# Patient Record
Sex: Male | Born: 2000 | Race: Black or African American | Hispanic: No | Marital: Single | Smoking: Never smoker
Health system: Southern US, Community
[De-identification: ages and names within clinical notes are randomized; demographics above are authoritative.]

## PROBLEM LIST (undated history)

## (undated) DIAGNOSIS — F840 Autistic disorder: Secondary | ICD-10-CM

## (undated) DIAGNOSIS — F819 Developmental disorder of scholastic skills, unspecified: Secondary | ICD-10-CM

## (undated) DIAGNOSIS — Z91010 Allergy to peanuts: Secondary | ICD-10-CM

## (undated) DIAGNOSIS — J309 Allergic rhinitis, unspecified: Secondary | ICD-10-CM

## (undated) DIAGNOSIS — T7840XA Allergy, unspecified, initial encounter: Secondary | ICD-10-CM

## (undated) HISTORY — PX: OTHER SURGICAL HISTORY: SHX169

## (undated) HISTORY — DX: Allergy to peanuts: Z91.010

## (undated) HISTORY — DX: Allergy, unspecified, initial encounter: T78.40XA

## (undated) HISTORY — DX: Autistic disorder: F84.0

---
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
## 2019-01-02 ENCOUNTER — Ambulatory Visit (HOSPITAL_COMMUNITY)
Admission: EM | Admit: 2019-01-02 | Discharge: 2019-01-02 | Disposition: A | Discharge status: Home / Self Care | Payer: Medicaid Other | Attending: Urgent Care | Admitting: Urgent Care

## 2019-01-02 ENCOUNTER — Other Ambulatory Visit: Payer: Self-pay

## 2019-01-02 ENCOUNTER — Encounter (HOSPITAL_COMMUNITY): Payer: Self-pay | Admitting: Emergency Medicine

## 2019-01-02 DIAGNOSIS — A084 Viral intestinal infection, unspecified: Secondary | ICD-10-CM

## 2019-01-02 DIAGNOSIS — R0982 Postnasal drip: Secondary | ICD-10-CM

## 2019-01-02 DIAGNOSIS — R1110 Vomiting, unspecified: Secondary | ICD-10-CM

## 2019-01-02 MED ORDER — ONDANSETRON 4 MG PO TBDP: 4.0000 mg | ORAL_TABLET | Freq: Three times a day (TID) | ORAL | 0 refills | Status: DC | PRN

## 2019-01-02 MED ORDER — LEVOCETIRIZINE DIHYDROCHLORIDE 5 MG PO TABS: 5.0000 mg | ORAL_TABLET | Freq: Every evening | ORAL | 1 refills | Status: DC

## 2019-01-02 NOTE — ED Triage Notes (Signed)
Poor appetite for a week.  Mother also reports a lot of sleeping.   Last week had cough, diarrhea and vomiting Monday-Wednesday.  pcp gave nausea medicine and this resolved.

## 2019-01-02 NOTE — ED Provider Notes (Signed)
MRN: [id number] DOB: [birth year]  Subjective:   Brent Yang is a 18 y.o. male presenting for 6 day history of n/v, diarrhea. He saw his PCP, improved his n/v, diarrhea. Hasn't had any more of this after taking Zofran. He does not have regular bm. His appetite is decreased from smelling food. He is asking for foods but cannot follow through on eating, mom suspects this is from vomiting. He has allergy to peanuts and mom wonders if he had some peanuts by accident. She tried to use epi pen but thinks she did it wrong. She states that today he is acting more like his normal self.  No current facility-administered medications for this encounter.   Current Outpatient Medications:  .  Amantadine HCl 100 MG tablet, Take 100 mg by mouth 2 (two) times daily., Disp: , Rfl:  .  Carbamazepine (EQUETRO) 300 MG CP12, Take by mouth., Disp: , Rfl:  .  EPINEPHrine 0.3 mg/0.3 mL IJ SOAJ injection, Inject 0.3 mg into the muscle., Disp: , Rfl:  .  EPINEPHrine 0.3 mg/0.3 mL IJ SOAJ injection, Inject contents of one device, 0.3 ml, into muscle in event of anaphylaxis, Disp: 2 Device, Rfl: 12 .  levocetirizine (XYZAL) 5 MG tablet, Take 1 tablet (5 mg total) by mouth every evening., Disp: 90 tablet, Rfl: 1 .  ondansetron (ZOFRAN-ODT) 4 MG disintegrating tablet, Take 1 tablet (4 mg total) by mouth every 8 (eight) hours as needed for nausea or vomiting., Disp: 15 tablet, Rfl: 0 .  paliperidone (INVEGA) 3 MG 24 hr tablet, TAKE 1 TABLET (3 MG TOTAL) BY MOUTH TWO (2) TIMES A DAY., Disp: , Rfl: 3 .  paliperidone (INVEGA) 6 MG 24 hr tablet, Take 6 mg by mouth daily., Disp: , Rfl:  .  Polyethylene Glycol 3350 (PEG 3350) POWD, Follow instructions for cleanout. Then mix 1 cap in 8 oz of clear liquid daily, can increase to twice daily as needed., Disp: , Rfl:  .  propranolol (INDERAL) 80 MG tablet, Take 80 mg by mouth 3 (three) times daily., Disp: , Rfl:    Allergies  Allergen Reactions  . Peanuts [Peanut Oil] Other (See  Comments)    Per allergy test    Past Medical History:  Diagnosis Date  . Allergy   . Autism   . Peanut allergy      Past Surgical History:  Procedure Laterality Date  . dental extractions      Review of Systems  Constitutional: Negative for fever and malaise/fatigue.  HENT: Positive for congestion. Negative for ear pain, sinus pain and sore throat.   Eyes: Negative for blurred vision, double vision, discharge and redness.  Respiratory: Negative for cough, hemoptysis, shortness of breath and wheezing.   Cardiovascular: Negative for chest pain.  Gastrointestinal: Positive for diarrhea, nausea and vomiting. Negative for abdominal pain.  Genitourinary: Negative for dysuria, flank pain and hematuria.  Musculoskeletal: Negative for myalgias.  Skin: Negative for rash.  Neurological: Negative for weakness and headaches.  Psychiatric/Behavioral: Negative for depression and substance abuse.    Objective:   Vitals: BP 111/88 (BP Location: Left Arm)   Pulse 98   Temp 98.2 F (36.8 C) (Temporal)   Resp 18   SpO2 100%   Physical Exam Constitutional:      Appearance: Normal appearance. He is well-developed and normal weight.  HENT:     Head: Normocephalic and atraumatic.     Right Ear: External ear normal.     Left Ear: External ear normal.  Nose: Nose normal.     Mouth/Throat:     Pharynx: Oropharynx is clear. No oropharyngeal exudate or posterior oropharyngeal erythema.     Comments: Significant post-nasal drainage. Eyes:     Extraocular Movements: Extraocular movements intact.     Pupils: Pupils are equal, round, and reactive to light.  Cardiovascular:     Rate and Rhythm: Normal rate.  Pulmonary:     Effort: Pulmonary effort is normal.  Neurological:     Mental Status: He is alert and oriented to person, place, and time.  Psychiatric:        Mood and Affect: Mood normal.        Behavior: Behavior normal.    Assessment and Plan :   Viral  gastroenteritis  Post-nasal drainage  Vomiting, intractability of vomiting not specified, presence of nausea not specified, unspecified vomiting type - Plan: ondansetron (ZOFRAN-ODT) 4 MG disintegrating tablet  I suspect patient had a viral gastroenteritis that he is recovering from.  He may have an aversion right now and due to his autism is having a difficult time expressing this interesting foods as he had multiple episodes of nausea vomiting and diarrhea earlier last week.  Counseled patient's mother on supportive care, she feels reassured that he is returning to his normal self.  Recommended he use levocetirizine for his throat.  He does not have any signs of strep throat and therefore will not swab him.  Vital signs are very reassuring.  Patient's mother does report a strong history of allergies.  Will use levocetirizine for this.  Follow-up with PCP. Counseled patient on potential for adverse effects with medications prescribed today, patient verbalized understanding.    Wallis BambergMani, Kahealani Yankovich, PA-C 01/02/19 1056

## 2019-07-21 ENCOUNTER — Telehealth: Payer: Self-pay

## 2019-07-21 NOTE — Telephone Encounter (Signed)

## 2019-07-22 ENCOUNTER — Ambulatory Visit (INDEPENDENT_AMBULATORY_CARE_PROVIDER_SITE_OTHER): Payer: Medicaid Other | Admitting: Pediatrics

## 2019-07-22 ENCOUNTER — Telehealth: Payer: Self-pay | Admitting: Pediatrics

## 2019-07-22 ENCOUNTER — Other Ambulatory Visit: Payer: Self-pay

## 2019-07-22 VITALS — BP 120/78 | Ht 74.5 in | Wt 193.8 lb

## 2019-07-22 DIAGNOSIS — Z91010 Allergy to peanuts: Secondary | ICD-10-CM

## 2019-07-22 DIAGNOSIS — Z0001 Encounter for general adult medical examination with abnormal findings: Secondary | ICD-10-CM

## 2019-07-22 DIAGNOSIS — Z68.41 Body mass index (BMI) pediatric, 5th percentile to less than 85th percentile for age: Secondary | ICD-10-CM | POA: Diagnosis not present

## 2019-07-22 DIAGNOSIS — F840 Autistic disorder: Secondary | ICD-10-CM

## 2019-07-22 DIAGNOSIS — F71 Moderate intellectual disabilities: Secondary | ICD-10-CM

## 2019-07-22 MED ORDER — EPINEPHRINE 0.3 MG/0.3ML IJ SOAJ: INTRAMUSCULAR | 3 refills | Status: DC

## 2019-07-22 NOTE — Telephone Encounter (Signed)
Care Coordinator called and requested the status of a Level of Care Form to [facility] for the transitioning of care of the patient to a different facility. The Care Coordinator states that she faxed the form to [place] on Friday and that they patient was seen today (07/22/2019) and that the form should have been filled out today. She is requesting that the form be emailed or faxed to her at:  Email: [email or web address] Fax Number: [phone]  If there are any questions we may contact [phone], we may also contact this number when the form is sent.

## 2019-07-22 NOTE — Progress Notes (Signed)
Adolescent Well Care Visit Brent Yang is a 18 y.o. male who is here for well care.  He is accompanied by his mother.    PCP:  Lurlean Leyden, MD   History was provided by the mother.  Confidentiality was discussed with the patient and, if applicable, with caregiver as well. Patient's personal or confidential phone number: n/a   Current Issues: Current concerns include Cledis has Autism Spectrum Disorder and Intellectual Disability.  He is for his annual physical exam and for completion of paperwork for admission .  His last office visit was 2 years ago. Notes in Epic on mental health care at [facility] show no updates since March 2019. Mom states he is going to be inpatient/residential at [facility] for better assessment of diagnosis and care.  States she does not know how long because one source told her one month and another told her up to two years.  He is to be admitted November 10th.  Nutrition: Nutrition/Eating Behaviors: Mom states he has a good appetite and needs no dietary modifications Adequate calcium in diet?: yes Supplements/ Vitamins: no  Exercise/ Media: Play any Sports?/ Exercise: active in the home Screen Time:  not discussed Media Rules or Monitoring?: not discussed  Sleep:  Sleep: sleeps through the night  Social Screening: Lives with:  Parents and sister Parental relations:  good Activities, Work, and Research officer, political party?: can pick up after himself Concerns regarding behavior with peers?  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 He received physical, occupational and speech therapy at school.  Confidential Social History: Tobacco?  no Secondhand smoke exposure?  no Drugs/ETOH?  no  Sexually Active?  no   Pregnancy Prevention: abstinence  Safe at home, in school & in relationships?  Yes but behavior can be challenging for family to manage Safe to self?  Yes    Screenings: Patient has a dental home: yes  The patient was unable to complete the Rapid Assessment for Adolescent Preventive Services screening questionnaire and the PHQ-9.  Physical Exam:  Vitals:   07/22/19 1120  BP: 120/78  Weight: 193 lb 12.8 oz (87.9 kg)  Height: 6' 2.5" (1.892 m)   BP 120/78   Ht 6' 2.5" (1.892 m)   Wt 193 lb 12.8 oz (87.9 kg)   BMI 24.55 kg/m  Body mass index: body mass index is 24.55 kg/m. Blood pressure percentiles are not available for patients who are 18 years or older.  No exam data present  General Appearance:   alert, oriented, no acute distress and well nourished. Exam is limited because Athel will not remove his shoes, outer wear of lie down on exam table.  He is cooperative and pleasant with seated and standing exam, and will lift his shirt  HENT: Normocephalic, no obvious abnormality, conjunctiva clear  Mouth:   Normal appearing teeth and gums  Neck:   Supple; thyroid: no enlargement, symmetric, no tenderness/mass/nodules  Chest Normal male  Lungs:   Clear to auscultation bilaterally, normal work of breathing  Heart:   Regular rate and rhythm, S1 and S2 normal, no murmurs;   Abdomen:   Soft, non-tender, no mass, or organomegaly appreciated on limited exam with him seated  GU genitalia not examined; he is wearing disposable underwear  Musculoskeletal:   Tone and strength strong and symmetrical, all extremities               Lymphatic:  No cervical adenopathy  Skin/Hair/Nails:   Skin with mild post-inflammatory hyperpigmentation at right antecubital fossa but no active eczema  Neurologic:   Strength and gait appear wnl     Assessment and Plan:   1. Encounter for general adult medical examination with abnormal findings   2. Body mass index, pediatric, 5th percentile to less than 85th percentile for age   73. Peanut allergy   4. Autism spectrum disorder   5. Moderate intellectual disability     BMI is appropriate for age  Hearing  screening result:not examined Vision screening result: not examined  Unable to dispense flu vaccine here due to overage for Vaccines for Children Program; counseled mom on importance of getting vaccine at local pharmacy or at [facility].  Paperwork done for [facility] and faxed; copy of forms, vaccine record and med list given to mother.  Unable to accurately reconcile medications in chart because mom states she does not know names of his medications.  States he takes 4 medications daily and she earlier read the information from all bottles to the staff at [facility] for his new record. I made notation on forms from this office that record from Epic may not be accurate bc I am not the prescribing physician (psychiatry prescribes) and mom not able to clarify; there was not sufficient time to verify with the pharmacy due to need for forms to be done today.   Meds ordered this encounter  Medications  . EPINEPHrine 0.3 mg/0.3 mL IJ SOAJ injection    Sig: Inject contents of one device, 0.3 ml, into muscle in event of anaphylaxis    Dispense:  2 each    Refill:  3    Please dispense generic brand covered by his insurance   Advised on annual physical and discussed transition to adult care next year if he remains in our healthcare ares.  Maree Erie, MD

## 2019-07-22 NOTE — Telephone Encounter (Signed)
Dr. Dorothyann Peng has the form and she will filled them out.

## 2019-07-22 NOTE — Patient Instructions (Signed)
Buckley looks in good health today. I will fax the information to [facility] and mail you a copy for your records.  Please get a Flu vaccine for him at your earliest convenience.  It may be that they have it available at his upcoming placement or you can get it at [facility], etc.  [facility] will be a good place to transition his care by his birthday. Please let me know if this is good for you and I can enter a referral.  Next full check up due in one year.

## 2019-07-23 ENCOUNTER — Encounter: Payer: Self-pay | Admitting: Pediatrics

## 2020-08-21 ENCOUNTER — Other Ambulatory Visit: Payer: Self-pay | Admitting: Pediatrics

## 2020-08-21 DIAGNOSIS — Z91010 Allergy to peanuts: Secondary | ICD-10-CM

## 2020-09-19 ENCOUNTER — Telehealth: Payer: Self-pay

## 2020-09-19 NOTE — Telephone Encounter (Signed)
Please call Mom, Charlyne Quale at [phone] once FMLA form has been complete and is ready to be picked up. Thank you!

## 2020-09-19 NOTE — Telephone Encounter (Signed)
Forms placed in Dr. Lafonda Mosses folder. PE scheduled for 09/24/20.

## 2020-09-20 NOTE — Telephone Encounter (Signed)
Per Dr. Duffy Rhody: she will need to wait until PE 09/24/20 to complete forms.

## 2020-09-24 ENCOUNTER — Ambulatory Visit (INDEPENDENT_AMBULATORY_CARE_PROVIDER_SITE_OTHER): Payer: Medicaid Other | Admitting: Pediatrics

## 2020-09-24 ENCOUNTER — Encounter: Payer: Self-pay | Admitting: Pediatrics

## 2020-09-24 ENCOUNTER — Other Ambulatory Visit: Payer: Self-pay

## 2020-09-24 VITALS — BP 128/80 | HR 97 | Ht 75.2 in | Wt 182.6 lb

## 2020-09-24 DIAGNOSIS — F71 Moderate intellectual disabilities: Secondary | ICD-10-CM

## 2020-09-24 DIAGNOSIS — F840 Autistic disorder: Secondary | ICD-10-CM | POA: Diagnosis not present

## 2020-09-24 DIAGNOSIS — Z6822 Body mass index (BMI) 22.0-22.9, adult: Secondary | ICD-10-CM

## 2020-09-24 DIAGNOSIS — Z113 Encounter for screening for infections with a predominantly sexual mode of transmission: Secondary | ICD-10-CM | POA: Diagnosis not present

## 2020-09-24 DIAGNOSIS — Z0000 Encounter for general adult medical examination without abnormal findings: Secondary | ICD-10-CM | POA: Diagnosis not present

## 2020-09-24 LAB — POCT RAPID HIV: Rapid HIV, POC: NEGATIVE

## 2020-09-24 NOTE — Patient Instructions (Signed)
Continue with healthy eating habits for 5 or more fruits/vegetables a day. Encouraged water more than sweet beverages - try for 60 to 80 mls total fluids daily

## 2020-09-24 NOTE — Progress Notes (Signed)
Adolescent Well Care Visit Brent Yang is a 20 y.o. male who is here for well care. Brent Yang has Autism Spectrum Disorder and Intellectual Disability. He shows good comprehension but utilizes Theatre manager.    PCP:  Maree Erie, MD   History was provided by the mother.  Confidentiality was discussed with the patient and, if applicable, with caregiver as well. Patient's personal or confidential phone number: n/a  Current Issues: Current concerns include doing well.   Skiler has his last visit at this office November 2020 and was then admitted to [facility] in [place], [place] for better assessment and maximizing medication management.  Mom states he was there November 2020 to May 2021.  On discharge, he was placed in a group home setting and transitioned back in his family home full-time July 12, 2020. Mom states current medication management with Dr. Claudine Mouton and last visit was November with plan for visits every 3 months. Med list is reconciled in Epic aided by photos of prescriptions mom shows this physician.  She notes he now has cetirizine instead of levocetirizine and he is taking Vitamin D 2000 units daily.  Mom also states Brent Yang became infected with COVID in 202 while inpatient at [facility]; he later got COVID vaccine there, too.  Nutrition: Nutrition/Eating Behaviors: appetite is good without restrictions, water once a day - prefers sweet beverage Adequate calcium in diet?: yogurt in am x 1 -2 cups Supplements/ Vitamins: Vitamin D, mom wants to add Vitamin C to see if helps his immunity  Exercise/ Media: Play any Sports?/ Exercise: active in school  Screen Time:  Likes to watch basketball Media Rules or Monitoring?: yes  Sleep:  Sleep: varies between 9:30 and 11 pm and up at 6:30 am  Social Screening: Lives with:  Mom and 10 y sister; no pets Parental relations:  good Activities, Work, and Regulatory affairs officer?: takes out trash, can help put away his  clothes Concerns regarding behavior with peers?  no Stressors of note: no Needs help with dressing, regular underwear (disposable undergarments not needed during the day and were not helpful preventing wetting bed at night) MGM respite care provider.  Education: School Name: [employer], [place] [employer] Grade: special classes School performance: doing well; no concerns School Behavior: doing well; no concerns He receives PT and OT at school. Mom states he is able to attend until age 25 years.   Confidential Social History: Tobacco?  no Secondhand smoke exposure?  no Drugs/ETOH?  no  Sexually Active?  no   Pregnancy Prevention: abstinence  Safe at home, in school & in relationships?  Yes Safe to self?  Yes   Screenings: Patient has a dental home: yes  The patient was not able to complete the Rapid Assessment for Adolescent Preventive Services screening questionnaire; however, mom completed the form for him and noted issue with seatbelt compliance.Counseled on personal safety in his environment. PHQ-9 completed by mom for him and at 5 (sleep, concentration, fidgeting).  Mom also noted Brent Yang cried 2 days ago for unsure reason.  She noted year as somewhat difficult for him; no self-harm attempts.  Physical Exam:  Vitals:   09/24/20 0853  BP: 128/80  Pulse: 97  SpO2: 98%  Weight: 182 lb 9.6 oz (82.8 kg)  Height: 6' 3.2" (1.91 m)   BP 128/80 (BP Location: Right Arm, Patient Position: Sitting, Cuff Size: Normal)   Pulse 97   Ht 6' 3.2" (1.91 m)   Wt 182 lb 9.6  oz (82.8 kg)   SpO2 98%   BMI 22.70 kg/m  Body mass index: body mass index is 22.7 kg/m. Growth percentile SmartLinks can only be used for patients less than 40 years old.   Hearing Screening   125Hz  250Hz  500Hz  1000Hz  2000Hz  3000Hz  4000Hz  6000Hz  8000Hz   Right ear:           Left ear:           Comments: OAE left ear refer, right ear noisy   General Appearance:   Alert,  cooperative young man in no apparent distress.  He nods or signs in response to questions.  HENT: Normocephalic, no obvious abnormality, conjunctiva clear  Mouth:   Normal appearing teeth, no obvious dental caries  Neck:   Supple; thyroid: no enlargement, symmetric, no tenderness/mass/nodules  Chest Normal male  Lungs:   Clear to auscultation bilaterally, normal work of breathing  Heart:   Regular rate and rhythm, S1 and S2 normal, no murmurs;   Abdomen:   Soft, non-tender, no mass, or organomegaly  GU genitalia not examined - he refused getting on exam table and did not undress  Musculoskeletal:   Tone and strength strong and symmetrical, all extremities               Lymphatic:   No cervical adenopathy  Skin/Hair/Nails:   Skin warm, dry and intact, normal hair and nails  Neurologic:   Strength, gait, and coordination normal and age-appropriate     Assessment and Plan:   1. Encounter for general adult medical examination without abnormal findings   2. Screening examination for venereal disease   3. BMI 22.0-22.9, adult   4. Autism spectrum disorder   5. Moderate intellectual disability     BMI is appropriate for age; advised continuing healthy lifestyle habits.  Hearing screening result:abnormal; will discuss referral to audiology with mom. Vision screening result: unable to complete  No vaccines indicated today and he is overage for vaccines for this office.  Continue medication management with psychiatry.  Counseled on transition to adult care; mom voiced understanding and okayed referral to [facility]. Wellness visit due annually. , MD

## 2020-09-26 NOTE — Telephone Encounter (Signed)
Form was completed by Dr. Duffy Rhody; appears in media tab.

## 2020-09-29 ENCOUNTER — Encounter: Payer: Self-pay | Admitting: Pediatrics

## 2020-10-10 ENCOUNTER — Telehealth: Payer: Self-pay

## 2020-10-10 NOTE — Telephone Encounter (Signed)
Completed FMLA forms faxed to [facility] Disability [phone] at mom's request; original placed at front desk for parent pick up; copy placed in medical records folder for scanning.

## 2020-10-18 DIAGNOSIS — Z0289 Encounter for other administrative examinations: Secondary | ICD-10-CM

## 2020-12-19 ENCOUNTER — Telehealth: Payer: Self-pay | Admitting: Pediatrics

## 2020-12-19 NOTE — Telephone Encounter (Signed)
I contacted mom about FMLA forms because we just completed them in Jan 2022.  Mom stated she is not sure why they need new forms but she is requesting a schedule change (to be able to get Kelli off the bus afternoons) and thinks that may be reason why. I informed mom information will be the same as in Jan and I should have all ready for her to pick-up at front desk after 1:30 on tomorrow 4/07.  Mom stated problem with Jantz wetting the bed nightly and now wetting himself at school, which is a new problem.  She asks to be referred to Urologist.  I informed mom we should first check his urine for UTI and provide her with specimen container and wipes to get specimen at home and return to office ASAP on the same day as collection.  I asked mom to alert staff at time of forms pick-up that she is expecting to pick up some supplies from me and we will provide what is needed.  Mom voiced agreement with plan and expectation to get to office on Thursday 4/07 or Friday 4/08.

## 2020-12-20 NOTE — Telephone Encounter (Signed)
FMLA forms, urine specimen cup, wipes taken to front desk.

## 2020-12-21 NOTE — Telephone Encounter (Signed)
Ok. Thanks!

## 2020-12-21 NOTE — Telephone Encounter (Signed)
Called mother to check in on urine sample. Mother has been unable to come by clinic yesterday or today. Mother states [facility] has an appt Monday and she would prefer to have urine sample done at appt. Advised mother she may come pick up during Saturday clinic, mother states she would prefer to wait until Monday. Advised mother should Brent Yang have any fever,pain or increased symptoms [name] will need to be seen or sample collected sooner. Mother stated understanding.

## 2020-12-24 ENCOUNTER — Other Ambulatory Visit: Payer: Self-pay

## 2020-12-24 ENCOUNTER — Encounter: Payer: Self-pay | Admitting: Pediatrics

## 2020-12-24 ENCOUNTER — Ambulatory Visit (INDEPENDENT_AMBULATORY_CARE_PROVIDER_SITE_OTHER): Payer: Medicaid Other | Admitting: Pediatrics

## 2020-12-24 VITALS — BP 116/80 | Wt 195.6 lb

## 2020-12-24 DIAGNOSIS — Z7189 Other specified counseling: Secondary | ICD-10-CM

## 2020-12-24 DIAGNOSIS — Z7187 Encounter for pediatric-to-adult transition counseling: Secondary | ICD-10-CM

## 2020-12-24 DIAGNOSIS — F840 Autistic disorder: Secondary | ICD-10-CM

## 2020-12-24 DIAGNOSIS — K5901 Slow transit constipation: Secondary | ICD-10-CM

## 2020-12-24 DIAGNOSIS — R350 Frequency of micturition: Secondary | ICD-10-CM

## 2020-12-24 DIAGNOSIS — R809 Proteinuria, unspecified: Secondary | ICD-10-CM

## 2020-12-24 LAB — POCT URINALYSIS DIPSTICK
Bilirubin, UA: NEGATIVE
Blood, UA: NEGATIVE
Glucose, UA: NEGATIVE
Ketones, UA: NEGATIVE
Leukocytes, UA: NEGATIVE
Nitrite, UA: NEGATIVE
Protein, UA: POSITIVE — AB
Spec Grav, UA: 1.015 (ref 1.010–1.025)
Urobilinogen, UA: 0.2 E.U./dL
pH, UA: 6.5 (ref 5.0–8.0)

## 2020-12-24 MED ORDER — POLYETHYLENE GLYCOL 3350 17 GM/SCOOP PO POWD: ORAL | 6 refills | Status: DC

## 2020-12-24 NOTE — Progress Notes (Signed)
Subjective:    Patient ID: Brent Yang, male    DOB: 28-Aug-2001, 19 y.o.   MRN: [id number]  HPI Brent Yang is here for scheduled follow up due to complex health conditions.  He is diagnosed with Autism Spectrum Disorder and moderate intellectual disability. Brent Yang is accompanied by his mother, who is his primary care taker.  Mom states Brent Yang has continued in good health with exception of new daytime wetting problems and bedwetting most nights. He does not complain of pain.  He has a bowel movement daily but mom states it is "pasty stool" that sticks to his bottom and he does not defecate in the toilet. She states he was previously prescribed Miralax but she never started it. No complaint of pain. Further ROS negative and no modifying factors.  Nutrition:  His appetite is good and he eats a variety with no need for texture adaptation. He does not drink a lot of water.   Sleep:  Sleeping well aided by medication - average 9:30/10 pm to 6 am and stays asleep all night with the medication (would awaken during the night previously) No daytime sleepiness reported at school; may nap after school.  Social:  Attends [employer] and can continue there to age 72 years. Lives with his mom and 1 years old sister.  No pets. He helps with chores at home. He demonstrated good language understanding but does not have typical verbal communication; uses nonverbal communication skills (grunts, head nods and hand movements).  Behavior/Emotional:  Current psychiatric care with Dr. Claudine Mouton. Last appt with psychiatry was 4/06 and will have follow up every 3 months.  Dental Care:  will be in [place] at [facility] once mom completes and sends paperwork  COVID vaccine status:  Moderna vaccine administered 10/25/2019 and 11/22/2019 Other vaccines are Up to Date and listed both in [facility] and this electronic health record.  Medications: Outpatient Encounter  Medications as of 12/24/2020  Medication Sig Note  . polyethylene glycol powder (GLYCOLAX/MIRALAX) 17 GM/SCOOP powder Mix 1 capful in 8 oz liquid and drink once daily to promote soft stools; decrease dose to 1/2 capful if stools too loose   . amantadine (SYMMETREL) 100 MG capsule GIVE 1 CAPSULE BY MOUTH TWICE A DAY(8AM+4PM) FOR EPS PREVENTION   . Carbamazepine (EQUETRO) 300 MG CP12 Take by mouth.   . EPINEPHRINE 0.3 mg/0.3 mL IJ SOAJ injection INJECT CONTENTS OF ONE DEVICE, 0.3 ML, INTO MUSCLE IN EVENT OF ANAPHYLAXIS   . levocetirizine (XYZAL) 5 MG tablet Take 1 tablet (5 mg total) by mouth every evening.   . mirtazapine (REMERON) 15 MG tablet GIVE 1 TABLET BY MOUTH AT BEDTIME FOR SLEEP/MOOD   . paliperidone (INVEGA) 9 MG 24 hr tablet TAKE 1 TABLET BY MOUTH DAILY AT 8AM FOR AGGRESSION/MOOD   . propranolol (INDERAL) 40 MG tablet    . traZODone (DESYREL) 100 MG tablet    . [DISCONTINUED] Amantadine HCl 100 MG tablet Take 100 mg by mouth 2 (two) times daily. 12/24/2020: duplicate entry in his electronic record; he is still taking med  . [DISCONTINUED] paliperidone (INVEGA) 6 MG 24 hr tablet Take 6 mg by mouth daily.    No facility-administered encounter medications on file as of 12/24/2020.  (Verified meds with pharmacist.  Trazodone has not been dispensed since Jan 2022 and appears to be discontinued; however, mom could not remember names of all meds to verify.)  Review of Systems As noted in HPI above.  Objective:   Physical Exam Vitals reviewed.  Constitutional:      General: He is not in acute distress.    Appearance: Normal appearance. He is normal weight.  HENT:     Nose: Nose normal.     Mouth/Throat:     Mouth: Mucous membranes are moist.  Eyes:     Conjunctiva/sclera: Conjunctivae normal.  Cardiovascular:     Rate and Rhythm: Normal rate and regular rhythm.     Pulses: Normal pulses.     Heart sounds: Normal heart sounds. No murmur heard.   Pulmonary:     Effort: Pulmonary  effort is normal.     Breath sounds: Normal breath sounds.  Abdominal:     Palpations: Abdomen is soft.     Comments: Abdominal exam is limited due to patient seated and also demonstrating his desire for this MD to stop the palpation  Neurological:     Mental Status: He is alert.   Blood pressure 116/80, weight 195 lb 9.6 oz (88.7 kg). Results for orders placed or performed in visit on 12/24/20 (from the past 48 hour(s))  POCT urinalysis dipstick     Status: Abnormal   Collection Time: 12/24/20  9:06 AM  Result Value Ref Range   Color, UA     Clarity, UA     Glucose, UA Negative Negative   Bilirubin, UA negative    Ketones, UA negative    Spec Grav, UA 1.015 1.010 - 1.025   Blood, UA negative    pH, UA 6.5 5.0 - 8.0   Protein, UA Positive (A) Negative   Urobilinogen, UA 0.2 0.2 or 1.0 E.U./dL   Nitrite, UA negative    Leukocytes, UA Negative Negative   Appearance     Odor        Assessment & Plan:   1. Frequent urination Proteinuria Urinalysis unremarkable except protein; should be repeated at next visit. Discussed with mom that the increased wetting accidents likely secondary to bowel habits and will start on program to soften stools and have more efficient elimination. No urine culture or antibiotics indicated. Will check CMP due to protein in urine and need to monitor renal function with his medication, Advised patient to increase water intake and he nodded that he will do this. - POCT urinalysis dipstick  2. Slow transit constipation Ongoing thick stools and inadequate bowel emptying is likely cause of the bedwetting and daytime accidents; discussed all with mom. Encouraged mom to start the Miralax on a Friday pm to prevent risk of soiling accidents at school. Discussed starting dose and titration to maintain daily soft stool. Mom voiced willingness to try this. - polyethylene glycol powder (GLYCOLAX/MIRALAX) 17 GM/SCOOP powder; Mix 1 capful in 8 oz liquid and drink  once daily to promote soft stools; decrease dose to 1/2 capful if stools too loose  Dispense: 500 g; Refill: 6  3. Autism spectrum disorder Chronic health concerns are mainly his healthcare maintenance and need to regulate mood and sleep. He is to continue care with Dr. Claudine Mouton, psychiatry. Vincen will continue school attendance to maximum age of 2 (gives him one more year of attendance if desired). Due to his multiple medication and need to monitor renal function, labs entered to be done at [facility] (no phlebotomist available at our office today); mom voiced understanding and ability to take him for the labs. - Ambulatory referral to [facility]  4. Counseling for transition from pediatric to adult care provider Reviewed meds and process with  mom.  Mom stated no specific MD she would like to receive Phi as new patient. Referral to [facility] on [facility] discussed with mom and she agreed referral. Information entered.  He should have his appointment by his [birth year] birthday for follow up on chronic care needs and medication management.  Will need labs monitored (renal function) due to medication and weight gain monitored - recent gain of 13 pounds in 3 months. - Ambulatory referral to [facility]  Maree Erie, MD

## 2020-12-24 NOTE — Patient Instructions (Signed)
No infection in his urine.  Please start the Miralax as we discussed. Start with one capful mixed in liquid daily. Decrease to 1/2 capful if stools are too loose. Skip a day if still too lose. You can go back up on dose if stools become too firm again.  I will send a referral to [facility] to assume his health care this summer. They are located on [address] just down from the Emergency Room.

## 2020-12-27 ENCOUNTER — Encounter: Payer: Self-pay | Admitting: Developmental - Behavioral Pediatrics

## 2020-12-28 ENCOUNTER — Telehealth: Payer: Self-pay | Admitting: Pediatrics

## 2020-12-28 NOTE — Telephone Encounter (Signed)
I called and spoke with mom at 5:34 pm today to ask questions needed to complete med form for surgery.  Mom stated no knowledge of Jacorie having previous intubations or general anesthesia, no previous surgery, no previous seizures or problem with asthma.  Mom also stated he has not had problems with bleeding difficult to control.  She states no knowledge of herself, father, daughter having problems with anesthesia in the past.  Form completed with notation to surgeon that medications are prescribed by the psychiatrist and doses are subject to change from what is listed on the form without my knowledge and should be verified with mom.  Also noted limited exam due to patient tolerance; additionally, it was not known to this physician at time of appointment 12/24/2020 that medical form needed completion.  Form placed in RN box for faxing to surgeon/dentist.

## 2020-12-31 ENCOUNTER — Telehealth: Payer: Self-pay

## 2020-12-31 NOTE — Telephone Encounter (Signed)
Dental pre-op form faxed to [facility] [phone] as requested, confirmation received. Original placed in medical records folder for scanning.

## 2021-01-30 NOTE — Progress Notes (Signed)
    SUBJECTIVE:   CHIEF COMPLAINT / HPI:   Brent Yang is a 20 yo with PMH of autism, constipation, enuresis.  presents with his mom to establish care. Reports previous pediatrician Dr. Duffy Rhody at [facility] but was referred to our clinic due to age.   Patient about 20 minutes late to visit (went to wrong location) and was advised that it would be a shortened visit. Family was fine with this and wanted to proceed with visit today.   Patient is non verbal and mom providing history. Mom reports only concern today is about bed wetting at night. Reports this occurs about every night for many years. States she would take patient to restroom right before bedtime and then 1 hour later will go in the bed. States he will sometimes urinate in trash can instead of toilet. Denies pain or burning when urinating.   Mom also concerned about rapid weight gain. States there is not a change in his diet, but that his medications could be contributing.   Blood pressure today 158/94 which mom believes its because he was recently worked up. On repeat BP 135/80. Reports no previous issues with BP.   Lives with mom, dad, sister who is 44. Lived in [place] Nov 2020-Oct 2021  No pertinent family history    Allergic to peanuts   Medications current in chart   OBJECTIVE:   BP 135/80   Pulse (!) 107   Ht 6\' 3"  (1.905 m)   Wt 210 lb 6.4 oz (95.4 kg)   SpO2 100%   BMI 26.30 kg/m    Physical exam  General: well appearing, NAD Cardiovascular: RRR, no murmurs Lungs: CTAB. Normal WOB Abdomen: soft, non-distended, non-tender Skin: warm, dry. No edema  ASSESSMENT/PLAN:   No problem-specific Assessment & Plan notes found for this encounter.  Enuresis Patient presents with chronic nightly bed wetting. Discussed preventative measures with family.   - continue taking patient to restroom prior to bed - avoid fluids prior to bed time - recommended trying bed alarm mat   Autism Patient non verbal.  Currently on medications to help regulate mood and sleep. Follows with Dr. , psychiatry. Home medications: Amantadine 100mg , Carbamazepine 300mg , Mirtazapine 15mg , Paliperidone 9mg , Propranolol 40mg , Trazodone 100mg  Patient will need blood work since on mood medication. Lab closed since visit was late, but recommended returning for follow up appointment and will check labs at that time - continue home medications  - check renal function at next visit   Weight gain In April with previous PCP patient had 13 pound weight gain in 3 months. Medications likely contributing  -Assess weight at f/u   Follow up in the next couple weeks to follow up on enuresis and weight gain, as well as check blood work.    , DO [facility]

## 2021-01-31 ENCOUNTER — Other Ambulatory Visit: Payer: Self-pay

## 2021-01-31 ENCOUNTER — Ambulatory Visit (INDEPENDENT_AMBULATORY_CARE_PROVIDER_SITE_OTHER): Payer: Medicaid Other | Admitting: Family Medicine

## 2021-01-31 ENCOUNTER — Encounter: Payer: Self-pay | Admitting: Family Medicine

## 2021-01-31 VITALS — BP 135/80 | HR 107 | Ht 75.0 in | Wt 210.4 lb

## 2021-01-31 DIAGNOSIS — R32 Unspecified urinary incontinence: Secondary | ICD-10-CM | POA: Diagnosis not present

## 2021-01-31 DIAGNOSIS — N3944 Nocturnal enuresis: Secondary | ICD-10-CM | POA: Diagnosis present

## 2021-01-31 DIAGNOSIS — R635 Abnormal weight gain: Secondary | ICD-10-CM | POA: Diagnosis not present

## 2021-01-31 DIAGNOSIS — F840 Autistic disorder: Secondary | ICD-10-CM | POA: Diagnosis not present

## 2021-01-31 NOTE — Patient Instructions (Addendum)
It was great seeing you today!  Today you presented to establish care.  Please check-out at the front desk before leaving the clinic. I'd like to see you back in the next couple of weeks to get blood work done and discuss anything we did not get to address today, but if you need to be seen earlier than that for any new issues we're happy to fit you in, just give [place] a call!   If you haven't already, sign up for My Chart to have easy access to your labs results, and communication with your primary care physician.  Feel free to call with any questions or concerns at any time, at [phone].   Take care,  Dr. Cora Collum [facility]

## 2021-02-08 ENCOUNTER — Telehealth: Payer: Self-pay

## 2021-02-08 NOTE — Telephone Encounter (Signed)
Patient mother calling regarding FMLA paperwork she will be having sent over to Dr Idalia Needle. Mother would like Dr Idalia Needle call her regarding these forms in hopes that they can be completed before his next appointment on June 17th. Please call mother back.

## 2021-02-13 NOTE — Telephone Encounter (Signed)
Patients mother is calling stating she is wanting to know if doctor has FMLA paperwork, if so could doctor give her a call so she can explain something. Please advise. Thanks!

## 2021-02-18 NOTE — Telephone Encounter (Signed)
Patients mother is calling again concerning the FMLA paper work. We have still not received it. Patients mother confirmed out fax number and is going to ask Loletta Parish to resend [employer] paper work.   She would also like for Dr. Idalia Needle to call her back to discuss. The best call back number is [phone]

## 2021-02-26 ENCOUNTER — Telehealth: Payer: Self-pay

## 2021-02-26 NOTE — Telephone Encounter (Signed)
Pt's mother Charlyne Quale called [phone] requesting Dr. Idalia Needle call her back, she's trying to follow-up on whether or not doctor has received FMLA paperwork?

## 2021-02-27 ENCOUNTER — Telehealth: Payer: Self-pay | Admitting: Family Medicine

## 2021-02-27 NOTE — Telephone Encounter (Signed)
Mom came in filled out release of information for paperwork that is currently being filled out by Dr Idalia Needle.  Mom wants the completion of FMLA form to be Emailed to [employer] is on form.  Any questions please call Mom P#[phone]. Paperwork is in red folder.

## 2021-03-01 ENCOUNTER — Other Ambulatory Visit: Payer: Self-pay

## 2021-03-01 ENCOUNTER — Encounter: Payer: Self-pay | Admitting: Family Medicine

## 2021-03-01 ENCOUNTER — Ambulatory Visit (INDEPENDENT_AMBULATORY_CARE_PROVIDER_SITE_OTHER): Payer: Medicaid Other | Admitting: Family Medicine

## 2021-03-01 VITALS — BP 137/77 | HR 86 | Ht 73.5 in | Wt 211.0 lb

## 2021-03-01 DIAGNOSIS — F840 Autistic disorder: Secondary | ICD-10-CM | POA: Diagnosis not present

## 2021-03-01 DIAGNOSIS — R32 Unspecified urinary incontinence: Secondary | ICD-10-CM | POA: Diagnosis not present

## 2021-03-01 DIAGNOSIS — Z1159 Encounter for screening for other viral diseases: Secondary | ICD-10-CM | POA: Diagnosis not present

## 2021-03-01 MED ORDER — CETIRIZINE HCL 10 MG PO TABS: 10.0000 mg | ORAL_TABLET | Freq: Every day | ORAL | 3 refills | Status: DC

## 2021-03-01 NOTE — Patient Instructions (Signed)
It was great seeing you today!  Today we are doing blood work since some of the medication Prabhav is on can affect his kidneys. We are also checking his lipids considering his weight gain and also checking for hepatitis C as it is a 1 time screening recommendation. I will call you with any abnormal results.   I'd like to see you back in about 6 months, but if you need to be seen earlier than that for any new issues we're happy to fit you in, just give [place] a call!  Visit Reminders: - Stop by the pharmacy to pick up your prescriptions - Continue to work on your healthy eating habits and incorporating exercise into your daily life.    Regarding lab work today:  Due to recent changes in healthcare laws, you may see the results of your imaging and laboratory studies on MyChart before your provider has had a chance to review them.  I understand that in some cases there may be results that are confusing or concerning to you. Not all laboratory results come back in the same time frame and you may be waiting for multiple results in order to interpret others.  Please give [place] 72 hours in order for your provider to thoroughly review all the results before contacting the office for clarification of your results. If everything is normal, you will get a letter in the mail or a message in My Chart. Please give [place] a call if you do not hear from [place] after 2 weeks.  Please bring all of your medications with you to each visit.    If you haven't already, sign up for My Chart to have easy access to your labs results, and communication with your primary care physician.  Feel free to call with any questions or concerns at any time, at [phone].   Take care,  Dr. Cora Collum [facility]

## 2021-03-01 NOTE — Progress Notes (Addendum)
    SUBJECTIVE:   CHIEF COMPLAINT / HPI:   Brent Yang is a 20 yo who presents with his mom for a follow up from his new patient visit on 5/19 as well as follow up on FMLA forms.  Mom expresses concern about weight gain- states Brent Yang has been eating a lot of carbs. Finds him in the room with bags of chips. Mom state she is working on not Social worker in the house so he doesn't eat it.   Has stools every day but they have pasty. Not sure how often he stools because she works during the day.  Endorses hx of chronic constipation and would give Miralax. Denies current miralax use.    OBJECTIVE:   BP 137/77   Pulse 86   Ht 6' 1.5" (1.867 m)   Wt 211 lb (95.7 kg)   BMI 27.46 kg/m    General: alert, NAD CV: RRR no murmurs Resp: CTAB normal WOB GI: soft, non distended, non tender Derm: no visible rashes or lesions. No LE edema   ASSESSMENT/PLAN:   No problem-specific Assessment & Plan notes found for this encounter.  C/f weight gain Weight 211 lb, BMI 27.46. Weighed 210 at last visit 1 month ago, but with a 13 pound weight gain over 3 months prior. Medications possibly contributing. Discussed avoiding bringing home foods without nutritional value so Brent Yang won't binge on them. Encouraged physical activity. lipid panel  - continue healthy eating and physical activity   Autism Home medications: Amantadine 100mg , Carbamazepine 300mg , Mirtazapine 15mg , Paliperidone 9mg , Propranolol 40mg . Stopped taking Trazodone because of erections. Denies any recent blood work and was recommended to check labs by previous pediatrician.  - f/u CMP, CBC  Allergic rhinitis  - Zyrtec 10mg  daily   Misc Completed FMLA paperwork for mom. She requests restricted work hours due to being . Had one section for mom to complete during visit. Faxed paperwork immediately after visit  , DO [facility]

## 2021-03-02 LAB — CBC
Hematocrit: 40.5 % (ref 37.5–51.0)
Hemoglobin: 14.1 g/dL (ref 13.0–17.7)
MCH: 32.7 pg (ref 26.6–33.0)
MCHC: 34.8 g/dL (ref 31.5–35.7)
MCV: 94 fL (ref 79–97)
Platelets: 217 10*3/uL (ref 150–450)
RBC: 4.31 x10E6/uL (ref 4.14–5.80)
RDW: 11.3 % — ABNORMAL LOW (ref 11.6–15.4)
WBC: 3.4 10*3/uL (ref 3.4–10.8)

## 2021-03-02 LAB — COMPREHENSIVE METABOLIC PANEL
ALT: 39 IU/L (ref 0–44)
AST: 25 IU/L (ref 0–40)
Albumin/Globulin Ratio: 1.9 (ref 1.2–2.2)
Albumin: 4.5 g/dL (ref 4.1–5.2)
Alkaline Phosphatase: 110 IU/L (ref 51–125)
BUN/Creatinine Ratio: 11 (ref 9–20)
BUN: 11 mg/dL (ref 6–20)
Bilirubin Total: 0.3 mg/dL (ref 0.0–1.2)
CO2: 25 mmol/L (ref 20–29)
Calcium: 9.7 mg/dL (ref 8.7–10.2)
Chloride: 103 mmol/L (ref 96–106)
Creatinine, Ser: 1.03 mg/dL (ref 0.76–1.27)
Globulin, Total: 2.4 g/dL (ref 1.5–4.5)
Glucose: 110 mg/dL — ABNORMAL HIGH (ref 65–99)
Potassium: 3.8 mmol/L (ref 3.5–5.2)
Sodium: 140 mmol/L (ref 134–144)
Total Protein: 6.9 g/dL (ref 6.0–8.5)
eGFR: 107 mL/min/{1.73_m2} (ref >59)

## 2021-03-02 LAB — LIPID PANEL
Chol/HDL Ratio: 2.9 ratio (ref 0.0–5.0)
Cholesterol, Total: 154 mg/dL (ref 100–199)
HDL: 54 mg/dL (ref >39)
LDL Chol Calc (NIH): 77 mg/dL (ref 0–99)
Triglycerides: 133 mg/dL (ref 0–149)
VLDL Cholesterol Cal: 23 mg/dL (ref 5–40)

## 2021-03-02 LAB — HCV AB W REFLEX TO QUANT PCR: HCV Ab: <0.1 s/co ratio (ref 0.0–0.9)

## 2021-03-02 LAB — HCV INTERPRETATION

## 2021-03-04 NOTE — Addendum Note (Signed)
Addended by: Manson Passey, Aayush Gelpi on: 03/04/2021 07:48 AM   Modules accepted: Level of Service

## 2021-03-04 NOTE — Telephone Encounter (Signed)
Patients mother is calling back concerning the FMLA paper work. She wanted to know if it has been emailed on Friday. I spoke with Dr. Idalia Needle and she said that the form and email asked for the form to be faxed so she faxed the completed FMLA on Friday.   Informed patients mother that it was faxed because that is what was requested on paperwork. She is upset that it was not emailed instead.   Patients mother would like for Dr. Idalia Needle to call to discuss. The best call back is [phone]

## 2021-10-01 ENCOUNTER — Other Ambulatory Visit: Payer: Self-pay | Admitting: Family Medicine

## 2021-10-01 DIAGNOSIS — R32 Unspecified urinary incontinence: Secondary | ICD-10-CM

## 2021-10-01 DIAGNOSIS — Z1159 Encounter for screening for other viral diseases: Secondary | ICD-10-CM

## 2021-10-01 DIAGNOSIS — F840 Autistic disorder: Secondary | ICD-10-CM

## 2022-02-04 ENCOUNTER — Other Ambulatory Visit: Payer: Self-pay | Admitting: Family Medicine

## 2022-02-04 DIAGNOSIS — Z1159 Encounter for screening for other viral diseases: Secondary | ICD-10-CM

## 2022-02-04 DIAGNOSIS — R32 Unspecified urinary incontinence: Secondary | ICD-10-CM

## 2022-02-04 DIAGNOSIS — F840 Autistic disorder: Secondary | ICD-10-CM

## 2022-02-06 ENCOUNTER — Encounter (HOSPITAL_COMMUNITY): Payer: Self-pay | Admitting: *Deleted

## 2022-02-06 ENCOUNTER — Ambulatory Visit (HOSPITAL_COMMUNITY)
Admission: EM | Admit: 2022-02-06 | Discharge: 2022-02-06 | Disposition: A | Discharge status: Home / Self Care | Payer: Medicaid Other | Attending: Family Medicine | Admitting: Family Medicine

## 2022-02-06 ENCOUNTER — Other Ambulatory Visit: Payer: Self-pay

## 2022-02-06 DIAGNOSIS — F840 Autistic disorder: Secondary | ICD-10-CM | POA: Diagnosis not present

## 2022-02-06 DIAGNOSIS — Z760 Encounter for issue of repeat prescription: Secondary | ICD-10-CM | POA: Diagnosis not present

## 2022-02-06 MED ORDER — EQUETRO 300 MG PO CP12: 300.0000 mg | ORAL_CAPSULE | Freq: Two times a day (BID) | ORAL | 1 refills | Status: AC

## 2022-02-06 MED ORDER — AMANTADINE HCL 100 MG PO CAPS: ORAL_CAPSULE | ORAL | 1 refills | Status: DC

## 2022-02-06 MED ORDER — PALIPERIDONE ER 9 MG PO TB24: ORAL_TABLET | ORAL | 1 refills | Status: AC

## 2022-02-06 MED ORDER — PROPRANOLOL HCL 40 MG PO TABS: 40.0000 mg | ORAL_TABLET | Freq: Two times a day (BID) | ORAL | 2 refills | Status: AC

## 2022-02-06 MED ORDER — MIRTAZAPINE 15 MG PO TABS: ORAL_TABLET | ORAL | 1 refills | Status: DC

## 2022-02-06 NOTE — ED Triage Notes (Signed)
Unable to check vitals because pt became agitated during attempt to do BP.

## 2022-02-06 NOTE — ED Triage Notes (Signed)
Family reports Pt next PCP visit is June 9 and meds refills are needed.

## 2022-02-11 NOTE — ED Provider Notes (Signed)
  [place]   PM:[id number] 02/06/22 Arrival Time: [place]:  1. Medication refill    Refilled: Meds ordered this encounter  Medications   amantadine (SYMMETREL) 100 MG capsule    Sig: GIVE 1 CAPSULE BY MOUTH TWICE A DAY(8AM+4PM) FOR EPS PREVENTION    Dispense:  60 capsule    Refill:  1   Carbamazepine (EQUETRO) 300 MG CP12    Sig: Take 1 capsule (300 mg total) by mouth 2 (two) times daily.    Dispense:  60 capsule    Refill:  1   paliperidone (INVEGA) 9 MG 24 hr tablet    Sig: TAKE 1 TABLET BY MOUTH DAILY AT 8AM FOR AGGRESSION/MOOD    Dispense:  30 tablet    Refill:  1   mirtazapine (REMERON) 15 MG tablet    Sig: GIVE 1 TABLET BY MOUTH AT BEDTIME FOR SLEEP/MOOD    Dispense:  30 tablet    Refill:  1   propranolol (INDERAL) 40 MG tablet    Sig: Take 1 tablet (40 mg total) by mouth 2 (two) times daily.    Dispense:  60 tablet    Refill:  2   Has planned PCP follow up.     Reviewed expectations re: course of current medical issues. Questions answered. Outlined signs and symptoms indicating need for more acute intervention. Understanding verbalized. After Visit Summary given.   SUBJECTIVE: History from: Caregiver. Brent Yang is a 21 y.o. male. Caregiver reports he is running out of medications and will run out before seeing PCP. Next PCP visit June 9. No other concerns.  OBJECTIVE:  There were no vitals filed for this visit.   Unable to check vitals because pt became agitated during attempt to do BP.  General appearance: alert; no distress   Allergies  Allergen Reactions   Peanuts [Peanut Oil] Other (See Comments)    Per allergy test    Past Medical History:  Diagnosis Date   Allergy    Autism    Peanut allergy    Social History   Socioeconomic History   Marital status: Single    Spouse name: Not on file   Number of children: Not on file   Years of education: Not on file   Highest education level: Not on file   Occupational History   Not on file  Tobacco Use   Smoking status: Never   Smokeless tobacco: Never  Substance and Sexual Activity   Alcohol use: Not on file   Drug use: Not on file   Sexual activity: Never    Birth control/protection: Abstinence  Other Topics Concern   Not on file  Social History Narrative   Not on file   Social Determinants of Health   Financial Resource Strain: Not on file  Food Insecurity: Not on file  Transportation Needs: Not on file  Physical Activity: Not on file  Stress: Not on file  Social Connections: Not on file  Intimate Partner Violence: Not on file   History reviewed. No pertinent family history. Past Surgical History:  Procedure Laterality Date   dental extractions       Vanessa Kick, MD 02/11/22 1259

## 2022-03-11 ENCOUNTER — Ambulatory Visit (INDEPENDENT_AMBULATORY_CARE_PROVIDER_SITE_OTHER): Payer: Medicaid Other | Admitting: Family Medicine

## 2022-03-11 VITALS — BP 124/74 | HR 88 | Ht 75.0 in | Wt 216.0 lb

## 2022-03-11 DIAGNOSIS — E663 Overweight: Secondary | ICD-10-CM

## 2022-03-11 DIAGNOSIS — Z0000 Encounter for general adult medical examination without abnormal findings: Secondary | ICD-10-CM

## 2022-03-11 DIAGNOSIS — F840 Autistic disorder: Secondary | ICD-10-CM

## 2022-03-11 NOTE — Progress Notes (Signed)
    SUBJECTIVE:   Chief compliant/HPI: annual examination  Brent Yang is a 21 y.o. who presents today for an annual exam.   Mom states patient is doing well, graduated school. Currently staying at home. Trying to get him in the autism society and do the day program through them as she did not like the day program he was previously doing.   Mom denies any particular concerns. Does need FMLA paperwork   Has a new psychiatrist - Ivan Anchors   Denies tobacco use, alcohol use, recreational drug use   Not sexually active   Weight: 216 up from 210 about a month ago Not eating much vegetables but mom states she is at fault because she hasnt been giving him much. Will incorporate more vegetables with meals  OBJECTIVE:   BP 124/74   Pulse 88   Ht 6\' 3"  (1.905 m)   Wt 216 lb (98 kg)   SpO2 98%   BMI 27.00 kg/m    Physical exam General: well appearing, NAD Cardiovascular: RRR, no murmurs Lungs: CTAB. Normal WOB Abdomen: soft, non-distended, non-tender Skin: warm, dry. No edema  ASSESSMENT/PLAN:   Autism spectrum disorder Currently doing well. Graduated school and mom trying to get him into Autism day program. Follows with new psychiatrist- Ivan Anchors. Current medications: Amantadine 100mg  BID, Carbamazepine 300mg  BID, Mirtazapine 15mg  daily, Paliperidone 9mg  daily, Propranolol 40mg  BID   Annual Examination  See AVS for age appropriate recommendations  PHQ score 9, reviewed and discussed.  Blood pressure reviewed and at goal.     Considered the following items based upon USPSTF recommendations: HIV testing: not indicated Hepatitis C: not indicated Hepatitis B: not indicated Syphilis if at high risk: {not indicated GC/CTnot indicated Lipid panel (nonfasting or fasting) discussed based upon AHA recommendations and ordered.  Consider repeat every 4-6 years. Higher risk due to antipsychotic medication so will check again this year Reviewed risk factors for  latent tuberculosis and not indicated Immunizations: discussed Tdap. Can walk in or go to pharmacy to get done   Will check CBC, BMP, A1c, lipid panel   Follow up in 1 year or sooner if indicated.    Cora Collum, DO [facility]

## 2022-03-12 LAB — BASIC METABOLIC PANEL
BUN/Creatinine Ratio: 9 (ref 9–20)
BUN: 9 mg/dL (ref 6–20)
CO2: 24 mmol/L (ref 20–29)
Calcium: 9.6 mg/dL (ref 8.7–10.2)
Chloride: 106 mmol/L (ref 96–106)
Creatinine, Ser: 0.96 mg/dL (ref 0.76–1.27)
Glucose: 115 mg/dL — ABNORMAL HIGH (ref 70–99)
Potassium: 3.8 mmol/L (ref 3.5–5.2)
Sodium: 141 mmol/L (ref 134–144)
eGFR: 115 mL/min/{1.73_m2} (ref >59)

## 2022-03-12 LAB — LIPID PANEL
Chol/HDL Ratio: 3.5 ratio (ref 0.0–5.0)
Cholesterol, Total: 139 mg/dL (ref 100–199)
HDL: 40 mg/dL (ref >39)
LDL Chol Calc (NIH): 82 mg/dL (ref 0–99)
Triglycerides: 86 mg/dL (ref 0–149)
VLDL Cholesterol Cal: 17 mg/dL (ref 5–40)

## 2022-03-12 LAB — CBC
Hematocrit: 40.3 % (ref 37.5–51.0)
Hemoglobin: 14.2 g/dL (ref 13.0–17.7)
MCH: 33.4 pg — ABNORMAL HIGH (ref 26.6–33.0)
MCHC: 35.2 g/dL (ref 31.5–35.7)
MCV: 95 fL (ref 79–97)
Platelets: 242 10*3/uL (ref 150–450)
RBC: 4.25 x10E6/uL (ref 4.14–5.80)
RDW: 11.5 % — ABNORMAL LOW (ref 11.6–15.4)
WBC: 4.7 10*3/uL (ref 3.4–10.8)

## 2022-03-12 LAB — HEMOGLOBIN A1C
Est. average glucose Bld gHb Est-mCnc: 88 mg/dL
Hgb A1c MFr Bld: 4.7 % — ABNORMAL LOW (ref 4.8–5.6)

## 2022-03-13 ENCOUNTER — Encounter: Payer: Self-pay | Admitting: Family Medicine

## 2022-03-13 NOTE — Progress Notes (Signed)
.  fmc

## 2022-03-21 ENCOUNTER — Telehealth: Payer: Self-pay | Admitting: Family Medicine

## 2022-03-21 NOTE — Telephone Encounter (Signed)
Patients mother called regarding this patients FMLA paperwork. She states these forms were emailed to Dr. Idalia Needle. Mother would like to know if we received this and if it has been completed. Please call mother with an update of these forms.

## 2022-03-28 NOTE — Telephone Encounter (Signed)
Patient's mother returned call to nurse line and LVM. Mother states that the paperwork contains the contact information for where we need to send the paperwork.   Will forward to PCP.   Veronda Prude, RN

## 2022-03-31 NOTE — Telephone Encounter (Signed)
Called patient's mother and informed. Mother states that she will come and sign paperwork tomorrow.   Veronda Prude, RN

## 2022-04-24 NOTE — Telephone Encounter (Signed)
Mom called back.  The forms were never received.  Refaxed and emailed as allowed in the ROI. Jone Baseman, CMA

## 2022-08-05 ENCOUNTER — Other Ambulatory Visit: Payer: Self-pay | Admitting: Family Medicine

## 2022-08-05 DIAGNOSIS — F840 Autistic disorder: Secondary | ICD-10-CM

## 2022-08-05 DIAGNOSIS — Z1159 Encounter for screening for other viral diseases: Secondary | ICD-10-CM

## 2022-08-05 DIAGNOSIS — R32 Unspecified urinary incontinence: Secondary | ICD-10-CM

## 2023-02-04 ENCOUNTER — Telehealth: Payer: Self-pay | Admitting: Family Medicine

## 2023-02-04 NOTE — Telephone Encounter (Signed)
Patients mother Charlyne Quale) called stating she sent FMLA paperwork to Dr. Reynolds Bowl email. She is wanting doctor to call her when she can.   Call back: [phone]  Please Advise.   Thanks!

## 2023-02-06 NOTE — Telephone Encounter (Signed)
Mother presents to clinic for [facility] paperwork. Faxed by Ms. Vea.   Copy made and placed in batch scanning.   Veronda Prude, RN

## 2023-02-17 NOTE — Telephone Encounter (Signed)
Form refaxed.   Attempted to call mother, however no answer or option for VM.

## 2023-02-17 NOTE — Telephone Encounter (Signed)
Patients mother calls nurse line in regards to [facility].   She reports the documents were received, however question 5 requests duration of time off from work.  She requests May 10th - June 30th to be added and refaxed.   Will forward to PCP.

## 2023-02-23 NOTE — Telephone Encounter (Signed)
Mother returns call to nurse line regarding paperwork.   Conference call completed with [employer]. They state that paperwork needs to be edited for question 5 to provide assistance for continuous leave. Dr. Idalia Needle made required edits. This has been faxed back to company.   Copy made for batch scanning. Original at front desk.   Veronda Prude, RN

## 2023-03-09 ENCOUNTER — Encounter: Payer: Self-pay | Admitting: Family Medicine

## 2023-03-09 ENCOUNTER — Other Ambulatory Visit: Payer: Self-pay

## 2023-03-09 ENCOUNTER — Ambulatory Visit (INDEPENDENT_AMBULATORY_CARE_PROVIDER_SITE_OTHER): Payer: Medicaid Other | Admitting: Family Medicine

## 2023-03-09 ENCOUNTER — Telehealth: Payer: Self-pay | Admitting: Family Medicine

## 2023-03-09 VITALS — BP 125/78 | HR 92 | Ht 74.0 in | Wt 193.2 lb

## 2023-03-09 DIAGNOSIS — Z0000 Encounter for general adult medical examination without abnormal findings: Secondary | ICD-10-CM | POA: Diagnosis not present

## 2023-03-09 DIAGNOSIS — F840 Autistic disorder: Secondary | ICD-10-CM

## 2023-03-09 NOTE — Progress Notes (Unsigned)
    SUBJECTIVE:   Chief compliant/HPI: annual examination  Brent Yang is a 22 y.o. who presents today for an annual exam.   Doing well no concerns   No longer attending adult daycare. Mom takes care of him at home   Follows with psychiatry with [facility]   No tobacco use, alcohol, recreational drug use  Walks for exercise in the morning   Not sexually active, no concern for STIs   OBJECTIVE:   BP 125/78   Pulse 92   Ht 6\' 2"  (1.88 m)   Wt 193 lb 3.2 oz (87.6 kg)   SpO2 100%   BMI 24.81 kg/m    Physical exam General: well appearing, NAD Cardiovascular: RRR, no murmurs Lungs: CTAB. Normal WOB Abdomen: soft, non-distended, non-tender Skin: warm, dry. No edema   ASSESSMENT/PLAN:   Autism spectrum disorder Currently doing well. Follows with [facility] for psychiatry. Current medication: Amantadine 100mg  BID, Carbamazepine 300mg  BID, Mirtazapine 15mg  daily, Paliperidone 9mg  daily, Propranolol 40mg  BID. Will check BMP, CBC, lipid panel     Annual Examination  See AVS for age appropriate recommendations  PHQ score 7, reviewed and discussed.  Blood pressure reviewed and at goal.     Considered the following items based upon USPSTF recommendations: Not sexually active so no concern for STIs  Lipid panel (nonfasting or fasting) discussed based upon AHA recommendations and ordered.  Consider repeat every 4-6 years.  Reviewed risk factors for latent tuberculosis and not indicated   Follow up in 1 year or sooner if indicated.    Cora Collum, DO [facility]

## 2023-03-09 NOTE — Telephone Encounter (Signed)
Patient's mother dropped off DMV form to be completed. Last DOS was 03/09/23. Placed in [employer].

## 2023-03-09 NOTE — Patient Instructions (Signed)
It was great seeing Brent Yang today!  Im glad to see he is doing well!  We will get blood work today and if anything is abnormal I will call you!   We will see him back next year for his next physical, but if you need to be seen earlier than that for any new issues we're happy to fit you in, just give [place] a call!  Feel free to call with any questions or concerns at any time, at [phone].   Take care,  Dr. Cora Collum [facility]

## 2023-03-10 LAB — CBC
Hematocrit: 39.8 % (ref 37.5–51.0)
Hemoglobin: 13.4 g/dL (ref 13.0–17.7)
MCH: 32.3 pg (ref 26.6–33.0)
MCHC: 33.7 g/dL (ref 31.5–35.7)
MCV: 96 fL (ref 79–97)
Platelets: 238 10*3/uL (ref 150–450)
RBC: 4.15 x10E6/uL (ref 4.14–5.80)
RDW: 11.9 % (ref 11.6–15.4)
WBC: 3.9 10*3/uL (ref 3.4–10.8)

## 2023-03-10 LAB — LIPID PANEL
Chol/HDL Ratio: 3.3 ratio (ref 0.0–5.0)
Cholesterol, Total: 140 mg/dL (ref 100–199)
HDL: 43 mg/dL (ref >39)
LDL Chol Calc (NIH): 81 mg/dL (ref 0–99)
Triglycerides: 83 mg/dL (ref 0–149)
VLDL Cholesterol Cal: 16 mg/dL (ref 5–40)

## 2023-03-10 LAB — BASIC METABOLIC PANEL
BUN/Creatinine Ratio: 15 (ref 9–20)
BUN: 14 mg/dL (ref 6–20)
CO2: 22 mmol/L (ref 20–29)
Calcium: 9.5 mg/dL (ref 8.7–10.2)
Chloride: 109 mmol/L — ABNORMAL HIGH (ref 96–106)
Creatinine, Ser: 0.96 mg/dL (ref 0.76–1.27)
Glucose: 91 mg/dL (ref 70–99)
Potassium: 4 mmol/L (ref 3.5–5.2)
Sodium: 143 mmol/L (ref 134–144)
eGFR: 115 mL/min/{1.73_m2} (ref >59)

## 2023-03-10 NOTE — Telephone Encounter (Signed)
Form has been placed in your box to be completed, please finish when you have time. Thank you! Alpha Mysliwiec CMA  

## 2023-03-10 NOTE — Assessment & Plan Note (Signed)
Currently doing well. Follows with [facility] for psychiatry. Current medication: Amantadine 100mg  BID, Carbamazepine 300mg  BID, Mirtazapine 15mg  daily, Paliperidone 9mg  daily, Propranolol 40mg  BID. Will check BMP, CBC, lipid panel

## 2023-03-10 NOTE — Telephone Encounter (Signed)
Patient's mother called and informed that forms are ready for pick up. Copy made and placed in batch scanning. Original placed at front desk for pick up.  ° °[name] Brooking C Shirl Ludington, RN ° ° °

## 2023-03-11 ENCOUNTER — Encounter: Payer: Self-pay | Admitting: Family Medicine

## 2023-03-16 ENCOUNTER — Encounter: Payer: Medicaid Other | Admitting: Student

## 2023-06-15 ENCOUNTER — Telehealth: Payer: Self-pay | Admitting: Family Medicine

## 2023-06-15 NOTE — Telephone Encounter (Signed)
Patients mother called checking the status of FMLA paperwork. I did not see it in the doctors box. She said it was faxed over about 2 weeks ago, and when she called the other day someone told her it was here.   Please Advise.   Thanks!

## 2023-06-18 NOTE — Telephone Encounter (Signed)
Patient's mother called and advised that forms were ready for pick up.   Copy made and placed in batch scanning. Original at front desk for pick up.   Veronda Prude, RN

## 2023-08-11 ENCOUNTER — Telehealth: Payer: Self-pay

## 2023-08-11 NOTE — Telephone Encounter (Signed)
Completed forms from [place] found in [employer].   Faxed back to [phone].  Copy made and placed in batch scanning.   Veronda Prude, RN

## 2023-09-22 ENCOUNTER — Encounter: Payer: Self-pay | Admitting: Family Medicine

## 2023-09-22 ENCOUNTER — Ambulatory Visit: Payer: MEDICAID | Admitting: Family Medicine

## 2023-09-22 VITALS — BP 102/62 | HR 92 | Ht 74.0 in | Wt 185.0 lb

## 2023-09-22 DIAGNOSIS — Z01818 Encounter for other preprocedural examination: Secondary | ICD-10-CM | POA: Diagnosis not present

## 2023-09-22 DIAGNOSIS — K047 Periapical abscess without sinus: Secondary | ICD-10-CM | POA: Diagnosis not present

## 2023-09-22 NOTE — Patient Instructions (Signed)
 Good to see you today - Thank you for coming in  Things we discussed today:  1) Brent Yang is a cleared to get his dental procedure  2) Please follow-up in the next 1 to 3 months to get some routine labs checked including cholesterol, blood sugar, and electrolytes.

## 2023-09-22 NOTE — Progress Notes (Signed)
    SUBJECTIVE:   CHIEF COMPLAINT / HPI:   Brent Yang is a 23yo M w/ hx of ASD, intellectual delay, allergic rhinitis that p/f dental clearance -Is getting a cleaning and a filling, potentiallly a root canal or extraction. - Will be under general anesthesia. Has undergone general anesthasia before for cleanings.  - No issues or reactions to anesthesia in past - No known bleeding disorders.   OBJECTIVE:   BP 102/62   Pulse 92   Ht 6' 2 (1.88 m)   Wt 185 lb (83.9 kg)   SpO2 98%   BMI 23.75 kg/m   General: Alert, pleasant well-appearing young man. NAD. HEENT: NCAT. MMM.  Oropharynx clear, no edema or erythema.  Neck supple CV: RRR, no murmurs. Resp: CTAB, no wheezing or crackles. Normal WOB on RA.  Abm: Soft, nontender, nondistended. BS present. Ext: Moves all ext spontaneously Skin: Warm, well perfused   ASSESSMENT/PLAN:   Assessment & Plan Dental infection Pt is medically cleared for general anesthesia and dental procedure.  Letter provided for mom and form completed.   -Advised to follow-up in the next few months for routine lab work including lipid panel, A1c, metabolic panel since patient is on paliperidone .  Twyla Nearing, MD [facility]

## 2023-09-23 ENCOUNTER — Encounter (HOSPITAL_COMMUNITY): Payer: Self-pay | Admitting: *Deleted

## 2023-09-23 ENCOUNTER — Ambulatory Visit: Payer: Self-pay | Admitting: Dentistry

## 2023-09-23 ENCOUNTER — Other Ambulatory Visit: Payer: Self-pay

## 2023-09-23 DIAGNOSIS — F840 Autistic disorder: Secondary | ICD-10-CM

## 2023-09-23 NOTE — Progress Notes (Signed)
 Anesthesia Chart Review: Brent Yang  Case: [id number] Date/Time: 09/24/23 1045   Procedure: DENTAL RESTORATION/EXTRACTION WITH X-RAY   Anesthesia type: General   Pre-op diagnosis: DENTAL CARRIES   Location: [facility] OR ROOM 12 / [facility] OR   Surgeons: Bonnell Margarito DEL, DMD       DISCUSSION: Patient is a 23 year old male scheduled for the above procedure.  History includes never smoker, autism, intellectual delay, allergic rhinitis, peanut allergy. H&P/medical evaluation by Elicia Hamlet, MD on 09/23/23.   No known prior anesthesia reactions or bleeding disorder.  Dr. Elicia wrote, Pt is medically cleared for general anesthesia and dental procedure.  VS:  Wt Readings from Last 3 Encounters:  09/22/23 83.9 kg  03/09/23 87.6 kg  03/11/22 98 kg   BP Readings from Last 3 Encounters:  09/22/23 102/62  03/09/23 125/78  03/11/22 124/74   Pulse Readings from Last 3 Encounters:  09/22/23 92  03/09/23 92  03/11/22 88     PROVIDERS: Elicia Hamlet, MD is PCP    LABS: For day of procedure if felt indicated. Last results in [facility] include: Lab Results  Component Value Date   WBC 3.9 03/09/2023   HGB 13.4 03/09/2023   HCT 39.8 03/09/2023   PLT 238 03/09/2023   GLUCOSE 91 03/09/2023   CHOL 140 03/09/2023   TRIG 83 03/09/2023   HDL 43 03/09/2023   LDLCALC 81 03/09/2023   ALT 39 03/01/2021   AST 25 03/01/2021   NA 143 03/09/2023   K 4.0 03/09/2023   CL 109 (H) 03/09/2023   CREATININE 0.96 03/09/2023   BUN 14 03/09/2023   CO2 22 03/09/2023   HGBA1C 4.7 (L) 03/11/2022    EKG: 07/27/16: SR   CV: N/A  Past Medical History:  Diagnosis Date   Allergy    Autism    Peanut allergy     Past Surgical History:  Procedure Laterality Date   dental extractions      MEDICATIONS: No current facility-administered medications for this encounter.    amantadine  (SYMMETREL ) 100 MG capsule   Carbamazepine  (EQUETRO ) 300 MG CP12   cetirizine  (ZYRTEC ) 10 MG tablet   EPINEPHRINE  0.3 mg/0.3 mL IJ  SOAJ injection   Melatonin 10 MG TABS   mirtazapine  (REMERON ) 15 MG tablet   paliperidone  (INVEGA ) 9 MG 24 hr tablet   polyethylene glycol powder (GLYCOLAX /MIRALAX ) 17 GM/SCOOP powder   propranolol  (INDERAL ) 40 MG tablet    Isaiah Ruder, PA-C Surgical Short Stay/Anesthesiology [facility] Phone [phone] [facility] Phone [phone] 09/23/2023 12:15 PM

## 2023-09-23 NOTE — Anesthesia Preprocedure Evaluation (Addendum)
 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, somewhat cooperative  negative psych ROS   GI/Hepatic negative GI ROS, Neg liver ROS,,,  Endo/Other  negative endocrine ROS    Renal/GU negative Renal ROS  negative genitourinary   Musculoskeletal negative musculoskeletal ROS (+)    Abdominal   Peds negative pediatric ROS (+)  Hematology negative hematology ROS (+)   Anesthesia Other Findings   Reproductive/Obstetrics negative OB ROS                             Anesthesia Physical Anesthesia Plan  ASA: 2  Anesthesia Plan: General   Post-op Pain Management: Ofirmev  IV (intra-op)* and Toradol IV (intra-op)*   Induction: Intravenous  PONV Risk Score and Plan: 2 and Ondansetron , Dexamethasone , Midazolam  and Treatment may vary due to age or medical condition  Airway Management Planned: Nasal ETT  Additional Equipment: None  Intra-op Plan:   Post-operative Plan: Extubation in OR  Informed Consent: I have reviewed the patients History and Physical, chart, labs and discussed the procedure including the risks, benefits and alternatives for the proposed anesthesia with the patient or authorized representative who has indicated his/her understanding and acceptance.     Dental advisory given  Plan Discussed with: CRNA  Anesthesia Plan Comments: (Refusing  to get in stretcher, combative on arrival, hitting himself in the mouth. Per mom, amenable to taking pills- premedicated w/ ativan  2mg  PO and normal AM dose of home propanolol. 25 minutes after ativan , still unwilling to get into stretcher, so additional 10mg  PO versed  given. after versef, able to get patient in stretcher and very cooperative w/ PIV placement. )       Anesthesia Quick Evaluation

## 2023-09-23 NOTE — Progress Notes (Signed)
 PCP - Dr Twyla Nearing Cardiologist - none  Chest x-ray - 10/01/06 EKG - 08/01/16 Stress Test - n/a ECHO - n/a Cardiac Cath - n/a  ICD Pacemaker/Loop - none  Sleep Study -  n/a CPAP - none  Diabetes - n/a  Blood Thinner Instructions: n/a  Aspirin Instructions: n/a  NPO   Anesthesia review: Yes  STOP now taking any Aspirin (unless otherwise instructed by your surgeon), Aleve, Naproxen, Ibuprofen , Motrin , Advil , Goody's, BC's, all herbal medications, fish oil, and all vitamins.   Coronavirus Screening Does the patient have any of the following symptoms:  Cough yes/no: No Fever (>100.31F)  yes/no: No Runny nose yes/no: No Sore throat yes/no: No Difficulty breathing/shortness of breath  yes/no: No  Has the patient traveled in the last 14 days and where? yes/no: No  Patient's mother Nagina Belford verbalized understanding of instructions that were given via phone.

## 2023-09-24 ENCOUNTER — Ambulatory Visit (HOSPITAL_COMMUNITY)
Admission: RE | Admit: 2023-09-24 | Discharge: 2023-09-24 | Disposition: A | Discharge status: Home / Self Care | Payer: MEDICAID | Attending: Dentistry | Admitting: Dentistry

## 2023-09-24 ENCOUNTER — Ambulatory Visit: Payer: Self-pay | Admitting: Dentistry

## 2023-09-24 ENCOUNTER — Other Ambulatory Visit: Payer: Self-pay

## 2023-09-24 ENCOUNTER — Ambulatory Visit (HOSPITAL_COMMUNITY): Payer: MEDICAID | Admitting: Vascular Surgery

## 2023-09-24 ENCOUNTER — Encounter (HOSPITAL_COMMUNITY)
Admission: RE | Disposition: A | Discharge status: Home / Self Care | Payer: Self-pay | Source: Home / Self Care | Attending: Dentistry

## 2023-09-24 ENCOUNTER — Encounter (HOSPITAL_COMMUNITY): Payer: Self-pay | Admitting: *Deleted

## 2023-09-24 ENCOUNTER — Ambulatory Visit (HOSPITAL_BASED_OUTPATIENT_CLINIC_OR_DEPARTMENT_OTHER): Payer: MEDICAID | Admitting: Vascular Surgery

## 2023-09-24 DIAGNOSIS — K029 Dental caries, unspecified: Secondary | ICD-10-CM

## 2023-09-24 DIAGNOSIS — F79 Unspecified intellectual disabilities: Secondary | ICD-10-CM | POA: Diagnosis not present

## 2023-09-24 DIAGNOSIS — F840 Autistic disorder: Secondary | ICD-10-CM | POA: Diagnosis not present

## 2023-09-24 DIAGNOSIS — Z91010 Allergy to peanuts: Secondary | ICD-10-CM | POA: Insufficient documentation

## 2023-09-24 HISTORY — DX: Developmental disorder of scholastic skills, unspecified: F81.9

## 2023-09-24 HISTORY — PX: DENTAL RESTORATION/EXTRACTION WITH X-RAY: SHX5796

## 2023-09-24 HISTORY — DX: Allergic rhinitis, unspecified: J30.9

## 2023-09-24 SURGERY — DENTAL RESTORATION/EXTRACTION WITH X-RAY
Anesthesia: General | Site: Mouth

## 2023-09-24 MED ORDER — EPHEDRINE SULFATE-NACL 50-0.9 MG/10ML-% IV SOSY
PREFILLED_SYRINGE | INTRAVENOUS | Status: DC | PRN
  Administered 2023-09-24: 12.5 mg via INTRAVENOUS
  Administered 2023-09-24: 7.5 mg via INTRAVENOUS
  Administered 2023-09-24: 5 mg via INTRAVENOUS

## 2023-09-24 MED ORDER — LIDOCAINE-EPINEPHRINE 2 %-1:100000 IJ SOLN
INTRAMUSCULAR | Status: AC
  Filled 2023-09-24: qty 5.1

## 2023-09-24 MED ORDER — MIDAZOLAM HCL 2 MG/ML PO SYRP
ORAL_SOLUTION | ORAL | Status: AC
  Administered 2023-09-24: 10 mg via ORAL
  Filled 2023-09-24: qty 5

## 2023-09-24 MED ORDER — ROCURONIUM BROMIDE 10 MG/ML (PF) SYRINGE
PREFILLED_SYRINGE | INTRAVENOUS | Status: DC | PRN
  Administered 2023-09-24: 20 mg via INTRAVENOUS
  Administered 2023-09-24 (×2): 5 mg via INTRAVENOUS
  Administered 2023-09-24: 60 mg via INTRAVENOUS

## 2023-09-24 MED ORDER — OXYCODONE HCL 5 MG/5ML PO SOLN: 5.0000 mg | Freq: Once | ORAL | Status: DC | PRN

## 2023-09-24 MED ORDER — PHENYLEPHRINE 80 MCG/ML (10ML) SYRINGE FOR IV PUSH (FOR BLOOD PRESSURE SUPPORT)
PREFILLED_SYRINGE | INTRAVENOUS | Status: AC
  Filled 2023-09-24: qty 10

## 2023-09-24 MED ORDER — PHENYLEPHRINE 80 MCG/ML (10ML) SYRINGE FOR IV PUSH (FOR BLOOD PRESSURE SUPPORT)
PREFILLED_SYRINGE | INTRAVENOUS | Status: DC | PRN
  Administered 2023-09-24: 160 ug via INTRAVENOUS
  Administered 2023-09-24: 80 ug via INTRAVENOUS
  Administered 2023-09-24: 160 ug via INTRAVENOUS
  Administered 2023-09-24 (×2): 80 ug via INTRAVENOUS
  Administered 2023-09-24: 240 ug via INTRAVENOUS

## 2023-09-24 MED ORDER — AMISULPRIDE (ANTIEMETIC) 5 MG/2ML IV SOLN: 10.0000 mg | Freq: Once | INTRAVENOUS | Status: DC | PRN

## 2023-09-24 MED ORDER — OXYMETAZOLINE HCL 0.05 % NA SOLN
NASAL | Status: DC | PRN
  Administered 2023-09-24: 1 via TOPICAL

## 2023-09-24 MED ORDER — ROCURONIUM BROMIDE 10 MG/ML (PF) SYRINGE
PREFILLED_SYRINGE | INTRAVENOUS | Status: AC
  Filled 2023-09-24: qty 10

## 2023-09-24 MED ORDER — SODIUM CHLORIDE 0.9 % IV SOLN: INTRAVENOUS | Status: DC | PRN

## 2023-09-24 MED ORDER — PROPOFOL 10 MG/ML IV BOLUS
INTRAVENOUS | Status: DC | PRN
  Administered 2023-09-24: 100 mg via INTRAVENOUS

## 2023-09-24 MED ORDER — ACETAMINOPHEN 10 MG/ML IV SOLN
INTRAVENOUS | Status: DC | PRN
  Administered 2023-09-24: 1000 mg via INTRAVENOUS

## 2023-09-24 MED ORDER — LIDOCAINE HCL 2 % IJ SOLN
INTRAMUSCULAR | Status: DC | PRN
  Administered 2023-09-24: 1.7 mL

## 2023-09-24 MED ORDER — DEXAMETHASONE SODIUM PHOSPHATE 10 MG/ML IJ SOLN
INTRAMUSCULAR | Status: DC | PRN
  Administered 2023-09-24: 10 mg via INTRAVENOUS

## 2023-09-24 MED ORDER — ONDANSETRON HCL 4 MG/2ML IJ SOLN
INTRAMUSCULAR | Status: AC
  Filled 2023-09-24: qty 2

## 2023-09-24 MED ORDER — ONDANSETRON HCL 4 MG/2ML IJ SOLN
INTRAMUSCULAR | Status: DC | PRN
  Administered 2023-09-24: 4 mg via INTRAVENOUS

## 2023-09-24 MED ORDER — SUGAMMADEX SODIUM 200 MG/2ML IV SOLN
INTRAVENOUS | Status: DC | PRN
  Administered 2023-09-24: 200 mg via INTRAVENOUS

## 2023-09-24 MED ORDER — FENTANYL CITRATE (PF) 250 MCG/5ML IJ SOLN
INTRAMUSCULAR | Status: AC
  Filled 2023-09-24: qty 5

## 2023-09-24 MED ORDER — FENTANYL CITRATE (PF) 250 MCG/5ML IJ SOLN
INTRAMUSCULAR | Status: DC | PRN
  Administered 2023-09-24: 50 ug via INTRAVENOUS

## 2023-09-24 MED ORDER — LIDOCAINE 2% (20 MG/ML) 5 ML SYRINGE
INTRAMUSCULAR | Status: DC | PRN
  Administered 2023-09-24: 60 mg via INTRAVENOUS

## 2023-09-24 MED ORDER — ACETAMINOPHEN 10 MG/ML IV SOLN
INTRAVENOUS | Status: AC
  Filled 2023-09-24: qty 100

## 2023-09-24 MED ORDER — EPHEDRINE 5 MG/ML INJ
INTRAVENOUS | Status: AC
  Filled 2023-09-24: qty 5

## 2023-09-24 MED ORDER — PROPRANOLOL HCL 40 MG PO TABS
40.0000 mg | ORAL_TABLET | Freq: Once | ORAL | Status: AC
  Administered 2023-09-24: 40 mg via ORAL
  Filled 2023-09-24: qty 1

## 2023-09-24 MED ORDER — MIDAZOLAM HCL 2 MG/2ML IJ SOLN
INTRAMUSCULAR | Status: AC
  Filled 2023-09-24: qty 2

## 2023-09-24 MED ORDER — PHENYLEPHRINE HCL-NACL 20-0.9 MG/250ML-% IV SOLN
INTRAVENOUS | Status: AC
  Filled 2023-09-24: qty 250

## 2023-09-24 MED ORDER — LORAZEPAM 1 MG PO TABS
2.0000 mg | ORAL_TABLET | Freq: Once | ORAL | Status: AC
  Administered 2023-09-24: 2 mg via ORAL
  Filled 2023-09-24: qty 1
  Filled 2023-09-24: qty 2

## 2023-09-24 MED ORDER — OXYMETAZOLINE HCL 0.05 % NA SOLN
NASAL | Status: AC
  Filled 2023-09-24: qty 30

## 2023-09-24 MED ORDER — FENTANYL CITRATE (PF) 100 MCG/2ML IJ SOLN: 25.0000 ug | INTRAMUSCULAR | Status: DC | PRN

## 2023-09-24 MED ORDER — ONDANSETRON HCL 4 MG/2ML IJ SOLN: 4.0000 mg | Freq: Once | INTRAMUSCULAR | Status: DC | PRN

## 2023-09-24 MED ORDER — PHENYLEPHRINE HCL-NACL 20-0.9 MG/250ML-% IV SOLN
INTRAVENOUS | Status: DC | PRN
  Administered 2023-09-24: 40 ug/min via INTRAVENOUS

## 2023-09-24 MED ORDER — LIDOCAINE 2% (20 MG/ML) 5 ML SYRINGE
INTRAMUSCULAR | Status: AC
  Filled 2023-09-24: qty 5

## 2023-09-24 MED ORDER — OXYCODONE HCL 5 MG PO TABS: 5.0000 mg | ORAL_TABLET | Freq: Once | ORAL | Status: DC | PRN

## 2023-09-24 MED ORDER — DEXAMETHASONE SODIUM PHOSPHATE 10 MG/ML IJ SOLN
INTRAMUSCULAR | Status: AC
  Filled 2023-09-24: qty 1

## 2023-09-24 MED ORDER — MIDAZOLAM HCL 2 MG/ML PO SYRP
10.0000 mg | ORAL_SOLUTION | Freq: Once | ORAL | Status: AC
Start: 2023-09-24 — End: 2023-09-24

## 2023-09-24 SURGICAL SUPPLY — 23 items
CANISTER SUCT 3000ML PPV (MISCELLANEOUS) ×1 IMPLANT
COVER BACK TABLE 60X90IN (DRAPES) ×1 IMPLANT
COVER MAYO STAND STRL (DRAPES) ×1 IMPLANT
COVER SURGICAL LIGHT HANDLE (MISCELLANEOUS) ×1 IMPLANT
DRAPE HALF SHEET 40X57 (DRAPES) ×1 IMPLANT
GAUZE PACKING FOLDED 2 STR (GAUZE/BANDAGES/DRESSINGS) ×1 IMPLANT
GAUZE SPONGE 4X4 16PLY XRAY LF (GAUZE/BANDAGES/DRESSINGS) ×1 IMPLANT
GLOVE BIO SURGEON STRL SZ 6.5 (GLOVE) ×1 IMPLANT
GOWN STRL REUS W/ TWL LRG LVL3 (GOWN DISPOSABLE) ×2 IMPLANT
KIT BASIN OR (CUSTOM PROCEDURE TRAY) ×1 IMPLANT
KIT TURNOVER KIT B (KITS) ×1 IMPLANT
NDL DENTAL 27 LONG (NEEDLE) ×1 IMPLANT
NEEDLE DENTAL 27 LONG (NEEDLE) ×1 IMPLANT
PAD ARMBOARD 7.5X6 YLW CONV (MISCELLANEOUS) ×2 IMPLANT
SPONGE SURGIFOAM ABS GEL SZ50 (HEMOSTASIS) IMPLANT
SUT CHROMIC 3 0 PS 2 (SUTURE) ×1 IMPLANT
SUT CHROMIC 3 0 SH 27 (SUTURE) IMPLANT
SYR BULB IRRIG 60ML STRL (SYRINGE) ×1 IMPLANT
TOOTHBRUSH ADULT (PERSONAL CARE ITEMS) ×1 IMPLANT
TOWEL GREEN STERILE (TOWEL DISPOSABLE) ×1 IMPLANT
TUBE CONNECTING 12X1/4 (SUCTIONS) ×1 IMPLANT
WATER TABLETS ICX (MISCELLANEOUS) ×1 IMPLANT
YANKAUER SUCT BULB TIP NO VENT (SUCTIONS) ×1 IMPLANT

## 2023-09-24 NOTE — Anesthesia Postprocedure Evaluation (Signed)
 Anesthesia Post Note  Patient: Brent Yang  Procedure(s) Performed: XRAY EXAM, FULL MOUTH DEBRIDEMENT, FILLING ON 2O, 14OBL, 15O, 19DOB; EXTRACTION OF TOOTH THREE (Mouth)     Patient location during evaluation: PACU Anesthesia Type: General Level of consciousness: awake and alert, oriented and patient cooperative Pain management: pain level controlled Vital Signs Assessment: post-procedure vital signs reviewed and stable Respiratory status: spontaneous breathing, nonlabored ventilation and respiratory function stable Cardiovascular status: blood pressure returned to baseline and stable Postop Assessment: no apparent nausea or vomiting Anesthetic complications: no   No notable events documented.  Last Vitals:  Vitals:   09/24/23 1315 09/24/23 1330  BP: 114/70 107/71  Pulse: 78 81  Resp: 20 19  Temp:    SpO2: 94% 100%    Last Pain:  Vitals:   09/24/23 1300  PainSc: 0-No pain                 Almarie HERO Mayuri Staples

## 2023-09-24 NOTE — Op Note (Addendum)
 [facility]  09/24/2023 Brent Yang [id number]  Preop DX: Dental caries/behavior management issues due to intellectual disabilities/developmental disabilities. Dental Care provided in OR for medically necessary treatment.  Surgeon: Margarito VEAR Councilman, DMD  Assistant: April Riggsbee, Joyce McSwain and hospital staff.  Anesthesia: General  Procedure: The patient was brought into the operating room and placed on the table in a supine position.  General anesthesia was administered via nasal intubation.  The patient was prepped and draped in the usual manner for an intra-oral general dentistry procedure. The oropharynx was suctioned and a moistened oropharyngeal throat pack was placed.    A full intra-oral exam including all hard and soft tissues was performed.  Type of Exam: Recall   Soft Tissue Exam: Floor of the mouth: Normal Buccal mucosa: Normal Soft palate: Normal Hard palate: Normal Tongue: Normal Gingival:  Inflammed Frenum: Normal  Hard tissue exam:  Present: # 2-15,19,30 Missing: # 1,16,17-18, 31-32 Radiographic findings decay: # 15O, 19D Root remnant: #3  Periapical radiographs taken of UL and LR and reviewed. A comprehensive treatment plan was developed.  Operative care was accomplished in a standard fashion using high/low speed drills with copious irrigation.  Routine extractions were accomplished with simple elevation and use of forceps.  All surgical sites were irrigated with copious amounts of saline. Gel foam was placed in the sockets and hemostasis established with firm pressure. Surgical sites were closed with 3-0 Chromic sutures.  Local Anes:Lidocaine  2% with 1:100,000 epinephrine  1.7 mls The estimated blood loss was 30 mls.   Upon completion of all procedures the oropharynx was irrigated of all debris. Mouth was suctioned dry and a posterior throat pack was carefully removed with constant suction. Hemostasis was established and a gauze pack was placed as  an intraoral pressure dressing. After spontaneous respirations the patient was extubated and transported to the Post-Anesthesia care unit in awake but in a sedated condition. The patient tolerated the procedure well and without complications.  An explanation of procedures and extractions were given to mother.  Operative Procedures:  Full mouth debridement: Yes Extractions completed: # 3 Glass Ionomers: # 2O (coronal/chewing/dentin), 14OBL (coronal/chewing/dentin), 15O (coronal/chewing/dentin), 19DOB (coronal/chewing/dentin) Fluoride varnish: Yes   Postoperative Meds:  OTC ibuprofen  600mg  and tylenol  500mg  every 6 hours if needed for pain.   Postoperative Instructions: Extraction sheet signed and given to patient representative.    Margarito VEAR Councilman, DMD

## 2023-09-24 NOTE — Transfer of Care (Signed)
 Immediate Anesthesia Transfer of Care Note  Patient: Brent Yang  Procedure(s) Performed: XRAY EXAM, FULL MOUTH DEBRIDEMENT, FILLING ON 2O, 14OBL, 15O, 19DOB; EXTRACTION OF TOOTH THREE (Mouth)  Patient Location: PACU  Anesthesia Type:General  Level of Consciousness: awake and drowsy  Airway & Oxygen Therapy: Patient Spontanous Breathing and Patient connected to face mask oxygen  Post-op Assessment: Report given to RN and Post -op Vital signs reviewed and stable  Post vital signs: Reviewed and stable  Last Vitals:  Vitals Value Taken Time  BP 118/77 09/24/23 1256  Temp    Pulse 76 09/24/23 1257  Resp 13 09/24/23 1257  SpO2 100 % 09/24/23 1257  Vitals shown include unfiled device data.  Last Pain: There were no vitals filed for this visit.       Complications: No notable events documented.

## 2023-09-24 NOTE — Brief Op Note (Signed)
 09/24/2023  1:05 PM  PATIENT:  Brent Yang  22 y.o. male  PRE-OPERATIVE DIAGNOSIS:  DENTAL CARRIES  POST-OPERATIVE DIAGNOSIS:  DENTAL CARRIES  PROCEDURE:  Procedure(s): XRAY EXAM, FULL MOUTH DEBRIDEMENT, FILLING ON 2O, 14OBL, 15O, 19DOB; EXTRACTION OF TOOTH THREE (N/A)  SURGEON:  Surgeons and Role:    * Bonnell Margarito DEL, DMD - Primary  PHYSICIAN ASSISTANT:   ASSISTANTS: April Riggsbee, Merlynn Grimes and hospital staff   ANESTHESIA:   general  EBL:  30 mL   BLOOD ADMINISTERED:none  DRAINS: none   LOCAL MEDICATIONS USED:  LIDOCAINE   and Amount: 1.7 ml  SPECIMEN:  No Specimen  DISPOSITION OF SPECIMEN:  N/A  COUNTS:  YES  TOURNIQUET:  * No tourniquets in log *  DICTATION: .Note written in EPIC  PLAN OF CARE: Discharge to home after PACU  PATIENT DISPOSITION:  PACU - hemodynamically stable.   Delay start of Pharmacological VTE agent (>24hrs) due to surgical blood loss or risk of bleeding: not applicable

## 2023-09-24 NOTE — H&P (Addendum)
H&P reviewed. Stable for surgery Rori Goar H Baylyn Sickles, DMD  

## 2023-09-24 NOTE — Anesthesia Procedure Notes (Signed)
 Procedure Name: Intubation Date/Time: 09/24/2023 10:57 AM  Performed by: Roslynn Waddell LABOR, CRNAPre-anesthesia Checklist: Patient identified, Emergency Drugs available, Suction available and Patient being monitored Patient Re-evaluated:Patient Re-evaluated prior to induction Oxygen Delivery Method: Circle System Utilized Preoxygenation: Pre-oxygenation with 100% oxygen Induction Type: IV induction Ventilation: Mask ventilation without difficulty Laryngoscope Size: Glidescope and 3 Nasal Tubes: Nasal Rae and Nasal prep performed Tube size: 7.5 mm Number of attempts: 1 Placement Confirmation: ETT inserted through vocal cords under direct vision, positive ETCO2 and breath sounds checked- equal and bilateral Tube secured with: Tape Dental Injury: Teeth and Oropharynx as per pre-operative assessment  Comments: Attempted N ETT through R nare, but unable to pass through (tube lubed). Tried L nare and able to easily pass N ETT. Used G/S and grade 1 view. Atraumatic induction and intubation. Nares sprayed with aspirin bilaterally prior to intubation

## 2023-09-25 ENCOUNTER — Encounter (HOSPITAL_COMMUNITY): Payer: Self-pay | Admitting: Dentistry

## 2023-12-29 ENCOUNTER — Ambulatory Visit (INDEPENDENT_AMBULATORY_CARE_PROVIDER_SITE_OTHER): Payer: MEDICAID | Admitting: Family Medicine

## 2023-12-29 ENCOUNTER — Encounter: Payer: Self-pay | Admitting: Family Medicine

## 2023-12-29 ENCOUNTER — Ambulatory Visit (HOSPITAL_COMMUNITY)
Admission: RE | Admit: 2023-12-29 | Discharge: 2023-12-29 | Disposition: A | Discharge status: Home / Self Care | Payer: MEDICAID | Source: Ambulatory Visit | Attending: Family Medicine | Admitting: Family Medicine

## 2023-12-29 VITALS — BP 88/65 | HR 98 | Ht 74.0 in | Wt 187.2 lb

## 2023-12-29 DIAGNOSIS — K529 Noninfective gastroenteritis and colitis, unspecified: Secondary | ICD-10-CM

## 2023-12-29 NOTE — Patient Instructions (Addendum)
 It was great to see you! Thank you for allowing me to participate in your care!  Our plans for today:  - Please go over to the hospital to get your X ray. - I will let you know the results of your labs. - Please collect the stool sample as directed and bring back to our clinic before Friday.   Please arrive 15 minutes PRIOR to your next scheduled appointment time! If you do not, this affects OTHER patients' care.  Take care and seek immediate care sooner if you develop any concerns.   Ivin Marrow, MD, PGY-2 [facility] 4:15 PM 12/29/2023  [facility]

## 2023-12-29 NOTE — Progress Notes (Unsigned)
    SUBJECTIVE:   CHIEF COMPLAINT / HPI: diarrhea for a month  Here with mom. Intermittent diarrhea since January. Having bowel movements 3-5 times per day.  Liquid stools. No travel recently. No recent antibiotics. No vomiting or nausea. More diarrhea, getting worse. No weight loss.   PERTINENT  PMH / PSH: Autism spectrum disorder, Hx of constipation  OBJECTIVE:   BP (!) 88/65   Pulse 98   Ht 6\' 2"  (1.88 m)   Wt 187 lb 3.2 oz (84.9 kg)   SpO2 100%   BMI 24.04 kg/m   General: NAD, well appearing Neuro: A&O Cardiovascular: RRR, no murmurs, no peripheral edema Respiratory: normal WOB on RA Abdomen: firm, diffusely tender, some guarding, no rebound Extremities: Moving all 4 extremities equally   ASSESSMENT/PLAN:   Assessment & Plan Chronic diarrhea Broad differential, IBD vs Celiac vs IBS. Reassuringly no red flag symptoms (weight loss, hematochezia), however, abdominal exam concerning for bowel irritation, but not peritonitic.  -CMP, CBC, Celiac panel, CRP -KUB -GI pathogen panel -Loperamide  Return in about 1 week (around 01/05/2024).  Ivin Marrow, MD [facility]   Addendum: I have reviewed the KUB and reviewed the radiology read.  Significant for severe colonic distention up to 20 cm with additional rectal distention to 10 cm likely secondary to large volume stool burden.  I called patient's mother who verified his date of birth.  I recommended they go to the ED for manual disimpaction as given the extent of distention he is at high risk for colonic injury and resulting life-threatening infection.  Mother verbalized understanding and will take patient to the ED.  May also benefit from CT abdomen.

## 2023-12-30 ENCOUNTER — Inpatient Hospital Stay (HOSPITAL_COMMUNITY)
Admission: EM | Admit: 2023-12-30 | Discharge: 2024-01-15 | DRG: 389 | Disposition: A | Discharge status: Home / Self Care | Payer: MEDICAID | Attending: Family Medicine | Admitting: Family Medicine

## 2023-12-30 ENCOUNTER — Other Ambulatory Visit: Payer: Self-pay

## 2023-12-30 ENCOUNTER — Encounter (HOSPITAL_COMMUNITY): Payer: Self-pay | Admitting: Emergency Medicine

## 2023-12-30 ENCOUNTER — Emergency Department (HOSPITAL_COMMUNITY): Payer: MEDICAID

## 2023-12-30 DIAGNOSIS — K5901 Slow transit constipation: Secondary | ICD-10-CM | POA: Diagnosis present

## 2023-12-30 DIAGNOSIS — F840 Autistic disorder: Secondary | ICD-10-CM | POA: Diagnosis present

## 2023-12-30 DIAGNOSIS — K5939 Other megacolon: Secondary | ICD-10-CM | POA: Diagnosis present

## 2023-12-30 DIAGNOSIS — Z789 Other specified health status: Secondary | ICD-10-CM

## 2023-12-30 DIAGNOSIS — K5641 Fecal impaction: Principal | ICD-10-CM | POA: Diagnosis present

## 2023-12-30 DIAGNOSIS — Z79899 Other long term (current) drug therapy: Secondary | ICD-10-CM

## 2023-12-30 DIAGNOSIS — K567 Ileus, unspecified: Secondary | ICD-10-CM | POA: Diagnosis present

## 2023-12-30 DIAGNOSIS — F71 Moderate intellectual disabilities: Secondary | ICD-10-CM | POA: Diagnosis present

## 2023-12-30 DIAGNOSIS — K5900 Constipation, unspecified: Principal | ICD-10-CM | POA: Diagnosis present

## 2023-12-30 DIAGNOSIS — E876 Hypokalemia: Secondary | ICD-10-CM | POA: Diagnosis present

## 2023-12-30 DIAGNOSIS — K6389 Other specified diseases of intestine: Secondary | ICD-10-CM

## 2023-12-30 DIAGNOSIS — Z91010 Allergy to peanuts: Secondary | ICD-10-CM

## 2023-12-30 DIAGNOSIS — D649 Anemia, unspecified: Secondary | ICD-10-CM | POA: Diagnosis present

## 2023-12-30 LAB — COMPREHENSIVE METABOLIC PANEL WITH GFR
ALT: 29 U/L (ref 0–44)
AST: 31 U/L (ref 15–41)
Albumin: 3.8 g/dL (ref 3.5–5.0)
Alkaline Phosphatase: 86 U/L (ref 38–126)
Anion gap: 6 (ref 5–15)
BUN: 11 mg/dL (ref 6–20)
CO2: 25 mmol/L (ref 22–32)
Calcium: 9.1 mg/dL (ref 8.9–10.3)
Chloride: 109 mmol/L (ref 98–111)
Creatinine, Ser: 0.92 mg/dL (ref 0.61–1.24)
GFR, Estimated: >60 mL/min (ref >60)
Glucose, Bld: 102 mg/dL — ABNORMAL HIGH (ref 70–99)
Potassium: 3.2 mmol/L — ABNORMAL LOW (ref 3.5–5.1)
Sodium: 140 mmol/L (ref 135–145)
Total Bilirubin: 0.5 mg/dL (ref 0.0–1.2)
Total Protein: 6.6 g/dL (ref 6.5–8.1)

## 2023-12-30 LAB — CBC WITH DIFFERENTIAL/PLATELET
Abs Immature Granulocytes: 0.01 10*3/uL (ref 0.00–0.07)
Basophils Absolute: 0.1 10*3/uL (ref 0.0–0.1)
Basophils Relative: 2 %
Eosinophils Absolute: 0.3 10*3/uL (ref 0.0–0.5)
Eosinophils Relative: 7 %
HCT: 37.2 % — ABNORMAL LOW (ref 39.0–52.0)
Hemoglobin: 12.6 g/dL — ABNORMAL LOW (ref 13.0–17.0)
Immature Granulocytes: 0 %
Lymphocytes Relative: 39 %
Lymphs Abs: 1.6 10*3/uL (ref 0.7–4.0)
MCH: 32.1 pg (ref 26.0–34.0)
MCHC: 33.9 g/dL (ref 30.0–36.0)
MCV: 94.9 fL (ref 80.0–100.0)
Monocytes Absolute: 0.3 10*3/uL (ref 0.1–1.0)
Monocytes Relative: 8 %
Neutro Abs: 1.8 10*3/uL (ref 1.7–7.7)
Neutrophils Relative %: 44 %
Platelets: 245 10*3/uL (ref 150–400)
RBC: 3.92 MIL/uL — ABNORMAL LOW (ref 4.22–5.81)
RDW: 12.5 % (ref 11.5–15.5)
WBC: 4.2 10*3/uL (ref 4.0–10.5)
nRBC: 0 % (ref 0.0–0.2)

## 2023-12-30 NOTE — ED Provider Triage Note (Addendum)
 Emergency Medicine Provider Triage Evaluation Note  Brent Yang , a 23 y.o. male  was evaluated in triage.  Pt complains of large stool burden seen on XR. He has been having liquid stools and had XR that shows significant stool burden with dilation to 20cm. The patient is not having any nausea or vomiting. Mom reports possibly he has been eating less. History is limited given patient's nonverbal status.  Review of Systems  Positive:  Negative:   Physical Exam  BP (!) 143/94 (BP Location: Left Arm)   Pulse 100   Temp 97.9 F (36.6 C)   Resp 16   SpO2 99%  Gen:   Awake, no distress   Resp:  Normal effort  MSK:   Moves extremities without difficulty  Other:  Abdomen does not appear tender to palpation, patient is smiling. Unsure if there is distention. Soft.   Medical Decision Making  Medically screening exam initiated at 7:28 PM.  Appropriate orders placed.  Lourdes D Nugent was informed that the remainder of the evaluation will be completed by another provider, this initial triage assessment does not replace that evaluation, and the importance of remaining in the ED until their evaluation is complete.  Will order CT given patient's non verbal status and to r/o stercoral colitis.   ADDENDUM: Spoke with Dr. Pecolia Bourbon with radiology who reports that the CT can be done without contrast.    Spence Dux, PA-C 12/30/23 1932    Spence Dux, PA-C 12/30/23 1951

## 2023-12-30 NOTE — ED Triage Notes (Signed)
  Patient was seen at PCP and diagnosed with severe constipated.  Has hx of MR and gets constipated but never this bad.  Takes miralax everyday at home but has not had BM in a week.  Has some diarrhea throughout the day but xray showed heavy stool burden.  Denies any N/V.

## 2023-12-31 ENCOUNTER — Inpatient Hospital Stay (HOSPITAL_COMMUNITY): Payer: MEDICAID

## 2023-12-31 DIAGNOSIS — K5900 Constipation, unspecified: Secondary | ICD-10-CM | POA: Diagnosis present

## 2023-12-31 DIAGNOSIS — K5901 Slow transit constipation: Secondary | ICD-10-CM | POA: Diagnosis present

## 2023-12-31 DIAGNOSIS — E876 Hypokalemia: Secondary | ICD-10-CM | POA: Diagnosis present

## 2023-12-31 DIAGNOSIS — K6389 Other specified diseases of intestine: Secondary | ICD-10-CM

## 2023-12-31 DIAGNOSIS — Z789 Other specified health status: Secondary | ICD-10-CM

## 2023-12-31 DIAGNOSIS — F840 Autistic disorder: Secondary | ICD-10-CM | POA: Diagnosis present

## 2023-12-31 DIAGNOSIS — F71 Moderate intellectual disabilities: Secondary | ICD-10-CM

## 2023-12-31 DIAGNOSIS — K567 Ileus, unspecified: Secondary | ICD-10-CM | POA: Diagnosis present

## 2023-12-31 DIAGNOSIS — K5939 Other megacolon: Secondary | ICD-10-CM | POA: Diagnosis present

## 2023-12-31 DIAGNOSIS — Z91010 Allergy to peanuts: Secondary | ICD-10-CM | POA: Diagnosis not present

## 2023-12-31 DIAGNOSIS — Z79899 Other long term (current) drug therapy: Secondary | ICD-10-CM | POA: Diagnosis not present

## 2023-12-31 DIAGNOSIS — D649 Anemia, unspecified: Secondary | ICD-10-CM | POA: Diagnosis present

## 2023-12-31 DIAGNOSIS — K5641 Fecal impaction: Secondary | ICD-10-CM | POA: Diagnosis present

## 2023-12-31 MED ORDER — CARBAMAZEPINE ER 300 MG PO CP12: 300.0000 mg | ORAL_CAPSULE | Freq: Two times a day (BID) | ORAL | Status: DC

## 2023-12-31 MED ORDER — ONDANSETRON HCL 4 MG/2ML IJ SOLN
INTRAMUSCULAR | Status: AC
  Filled 2023-12-31: qty 2

## 2023-12-31 MED ORDER — MIRTAZAPINE 15 MG PO TABS
15.0000 mg | ORAL_TABLET | Freq: Every day | ORAL | Status: DC
Start: 2023-12-31 — End: 2023-12-31

## 2023-12-31 MED ORDER — CARBAMAZEPINE ER 200 MG PO TB12
300.0000 mg | ORAL_TABLET | Freq: Two times a day (BID) | ORAL | Status: DC
  Administered 2023-12-31 – 2024-01-04 (×8): 300 mg via ORAL
  Filled 2023-12-31 (×11): qty 1

## 2023-12-31 MED ORDER — SENNA 8.6 MG PO TABS
1.0000 | ORAL_TABLET | Freq: Two times a day (BID) | ORAL | Status: DC
  Administered 2023-12-31: 8.6 mg via ORAL
  Filled 2023-12-31 (×2): qty 1

## 2023-12-31 MED ORDER — CARBAMAZEPINE ER 200 MG PO CP12: 300.0000 mg | ORAL_CAPSULE | Freq: Two times a day (BID) | ORAL | Status: DC

## 2023-12-31 MED ORDER — FLEET ENEMA RE ENEM
1.0000 | ENEMA | Freq: Once | RECTAL | Status: AC
  Administered 2023-12-31: 1 via RECTAL
  Filled 2023-12-31: qty 1

## 2023-12-31 MED ORDER — LORAZEPAM 2 MG/ML IJ SOLN
1.0000 mg | Freq: Once | INTRAMUSCULAR | Status: AC
  Administered 2023-12-31: 1 mg via INTRAMUSCULAR
  Filled 2023-12-31: qty 1

## 2023-12-31 MED ORDER — POTASSIUM CHLORIDE 10 MEQ/100ML IV SOLN
10.0000 meq | INTRAVENOUS | Status: AC
  Administered 2023-12-31: 10 meq via INTRAVENOUS
  Filled 2023-12-31: qty 100

## 2023-12-31 MED ORDER — AMANTADINE HCL 100 MG PO CAPS
100.0000 mg | ORAL_CAPSULE | Freq: Two times a day (BID) | ORAL | Status: DC
  Administered 2023-12-31 – 2024-01-08 (×17): 100 mg via ORAL
  Filled 2023-12-31 (×18): qty 1

## 2023-12-31 MED ORDER — PROPRANOLOL HCL 40 MG PO TABS
40.0000 mg | ORAL_TABLET | Freq: Two times a day (BID) | ORAL | Status: DC
  Administered 2023-12-31 – 2024-01-14 (×22): 40 mg via ORAL
  Filled 2023-12-31 (×28): qty 1
  Filled 2023-12-31: qty 4
  Filled 2023-12-31 (×3): qty 1

## 2023-12-31 MED ORDER — ENOXAPARIN SODIUM 40 MG/0.4ML IJ SOSY
40.0000 mg | PREFILLED_SYRINGE | INTRAMUSCULAR | Status: DC
  Administered 2023-12-31 – 2024-01-05 (×6): 40 mg via SUBCUTANEOUS
  Filled 2023-12-31 (×6): qty 0.4

## 2023-12-31 MED ORDER — ONDANSETRON HCL 4 MG/2ML IJ SOLN
4.0000 mg | Freq: Once | INTRAMUSCULAR | Status: AC
  Filled 2023-12-31: qty 2

## 2023-12-31 MED ORDER — POLYETHYLENE GLYCOL 3350 17 G PO PACK: 17.0000 g | PACK | Freq: Two times a day (BID) | ORAL | Status: DC

## 2023-12-31 MED ORDER — MILK AND MOLASSES ENEMA
1.0000 | Freq: Once | RECTAL | Status: AC
  Administered 2023-12-31: 240 mL via RECTAL
  Filled 2023-12-31: qty 240

## 2023-12-31 MED ORDER — ONDANSETRON HCL 4 MG/2ML IJ SOLN
4.0000 mg | Freq: Four times a day (QID) | INTRAMUSCULAR | Status: DC | PRN
  Administered 2023-12-31: 4 mg via INTRAVENOUS
  Filled 2023-12-31: qty 2

## 2023-12-31 MED ORDER — PALIPERIDONE ER 6 MG PO TB24
9.0000 mg | ORAL_TABLET | Freq: Every day | ORAL | Status: DC
  Administered 2023-12-31 – 2024-01-15 (×16): 9 mg via ORAL
  Filled 2023-12-31 (×18): qty 1

## 2023-12-31 MED ORDER — SMOG ENEMA
960.0000 mL | Freq: Once | RECTAL | Status: AC
  Administered 2023-12-31: 960 mL via RECTAL
  Filled 2023-12-31: qty 960

## 2023-12-31 MED ORDER — POTASSIUM CHLORIDE CRYS ER 20 MEQ PO TBCR: 40.0000 meq | EXTENDED_RELEASE_TABLET | Freq: Once | ORAL | Status: DC

## 2023-12-31 NOTE — Assessment & Plan Note (Addendum)
 Autism/behavioral disturbances - continue home carbamazepine, mirtazepine, paliperidone

## 2023-12-31 NOTE — ED Notes (Signed)
 Pt d/c IV himself. New IV placed. Unable to give 2nd bag of K+ w/ d/t time w/ new IV start

## 2023-12-31 NOTE — Assessment & Plan Note (Addendum)
 Chronic problem, and this particular episode has likely been going on for some time. Though colon is severely distended on imaging no evidence of perforation, no clinical signs of infection. Continue to pursue clean-out as below. - Admit to FMTS, attending Dr. McDiarmid - Med surg, Vital signs per floor - NPO at this time to help facilitate bowel clean-out - repeat KUB to evaluate s/p fleet enema in ED - GI pathogen panel - Miralax BID - senna BID - consider additional enema versus manual disimpaction. Consider GI consult

## 2023-12-31 NOTE — ED Notes (Signed)
 Enema administered, patient lying in bed awaiting bowel movement.

## 2023-12-31 NOTE — ED Notes (Signed)
 Admitting MD at bedside.

## 2023-12-31 NOTE — ED Provider Notes (Signed)
  EMERGENCY DEPARTMENT AT [facility] Provider Note   CSN: [id number] Arrival date & time: 12/30/23  1821     History  Chief Complaint  Patient presents with   Constipation   HPI Brent Yang is a 23 y.o. male with autism, nonverbal, intellectual delay presenting for constipation.  States he has not had a bowel movement in over a week.  Has been taking MiraLAX every day at home.  His mother is unsure if he is having abdominal pain.  Was seen Tuesday by his PCP.  They did x-ray at that time and there is concern per mother that he may be severely constipated.  She states he has been having some diarrhea but no nausea or vomiting.  No blood in the stool.  Denies fever.   Constipation      Home Medications Prior to Admission medications   Medication Sig Start Date End Date Taking? Authorizing Provider  amantadine (SYMMETREL) 100 MG capsule GIVE 1 CAPSULE BY MOUTH TWICE A DAY(8AM+4PM) FOR EPS PREVENTION 02/06/22   Mardella Layman, MD  Carbamazepine (EQUETRO) 300 MG CP12 Take 1 capsule (300 mg total) by mouth 2 (two) times daily. 02/06/22   Mardella Layman, MD  cetirizine (ZYRTEC) 10 MG tablet TAKE 1 TABLET BY MOUTH EVERY DAY Patient taking differently: Take 10 mg by mouth daily as needed for allergies. 08/05/22   Cora Collum, DO  EPINEPHRINE 0.3 mg/0.3 mL IJ SOAJ injection INJECT CONTENTS OF ONE DEVICE, 0.3 ML, INTO MUSCLE IN EVENT OF ANAPHYLAXIS 08/21/20   Maree Erie, MD  gabapentin (NEURONTIN) 400 MG capsule Take 400 mg by mouth 3 (three) times daily.    [provider]  Melatonin 10 MG TABS Take 10 mg by mouth at bedtime as needed (sleep).    [provider]  mirtazapine (REMERON) 15 MG tablet GIVE 1 TABLET BY MOUTH AT BEDTIME FOR SLEEP/MOOD 02/06/22   Mardella Layman, MD  paliperidone (INVEGA) 9 MG 24 hr tablet TAKE 1 TABLET BY MOUTH DAILY AT 8AM FOR AGGRESSION/MOOD 02/06/22   Mardella Layman, MD  polyethylene glycol powder  (GLYCOLAX/MIRALAX) 17 GM/SCOOP powder Mix 1 capful in 8 oz liquid and drink once daily to promote soft stools; decrease dose to 1/2 capful if stools too loose Patient taking differently: Take 17 g by mouth 4 (four) times a week. Mix 1 capful in 8 oz liquid and drink  to promote soft stools; decrease dose to 1/2 capful if stools too loose 12/24/20   Maree Erie, MD  propranolol (INDERAL) 40 MG tablet Take 1 tablet (40 mg total) by mouth 2 (two) times daily. 02/06/22   Mardella Layman, MD      Allergies    Peanuts [peanut oil]    Review of Systems   Review of Systems  Gastrointestinal:  Positive for constipation.    Physical Exam Updated Vital Signs BP 139/89   Pulse 89   Temp 97.9 F (36.6 C) (Axillary)   Resp 18   SpO2 98%  Physical Exam Vitals and nursing note reviewed.  HENT:     Head: Normocephalic and atraumatic.     Mouth/Throat:     Mouth: Mucous membranes are moist.  Eyes:     General:        Right eye: No discharge.        Left eye: No discharge.     Conjunctiva/sclera: Conjunctivae normal.  Cardiovascular:     Rate and Rhythm: Normal rate and regular rhythm.  Pulses: Normal pulses.     Heart sounds: Normal heart sounds.  Pulmonary:     Effort: Pulmonary effort is normal.     Breath sounds: Normal breath sounds.  Abdominal:     General: Abdomen is flat. There is distension.     Palpations: Abdomen is soft.     Tenderness: There is no abdominal tenderness.  Skin:    General: Skin is warm and dry.  Neurological:     General: No focal deficit present.  Psychiatric:        Mood and Affect: Mood normal.     ED Results / Procedures / Treatments   Labs (all labs ordered are listed, but only abnormal results are displayed) Labs Reviewed  CBC WITH DIFFERENTIAL/PLATELET - Abnormal; Notable for the following components:      Result Value   RBC 3.92 (*)    Hemoglobin 12.6 (*)    HCT 37.2 (*)    All other components within normal limits  COMPREHENSIVE  METABOLIC PANEL WITH GFR - Abnormal; Notable for the following components:   Potassium 3.2 (*)    Glucose, Bld 102 (*)    All other components within normal limits    EKG None  Radiology CT ABDOMEN PELVIS WO CONTRAST Result Date: 12/30/2023 CLINICAL DATA:  Large stool burden and dilated colon on plain films. Patient having liquid stools. EXAM: CT ABDOMEN AND PELVIS WITHOUT CONTRAST TECHNIQUE: Multidetector CT imaging of the abdomen and pelvis was performed following the standard protocol without IV contrast. RADIATION DOSE REDUCTION: This exam was performed according to the departmental dose-optimization program which includes automated exposure control, adjustment of the mA and/or kV according to patient size and/or use of iterative reconstruction technique. COMPARISON:  KUB today. FINDINGS: Lower chest: No acute abnormality. Hepatobiliary: No focal hepatic abnormality. Gallbladder unremarkable. Pancreas: Poorly visualized due to markedly distended stool-filled colon. Spleen: No focal abnormality.  Normal size. Adrenals/Urinary Tract: No adrenal abnormality. No focal renal abnormality. No stones or hydronephrosis. Urinary bladder is displaced into the right pelvis by the massively dilated sigmoid colon. Stomach/Bowel: Markedly distended stool-filled colon, most pronounced in the rectosigmoid colon. Sigmoid colon measures up to 20 cm in diameter. Stomach and small scar stat stomach and small bowel difficult to visualize due to the massively dilated colon. No visible small bowel dilatation. Vascular/Lymphatic: No evidence of aneurysm or adenopathy. Reproductive: No visible focal abnormality. Other: No free fluid or free air. Musculoskeletal: No acute bony abnormality. IMPRESSION: Massively distended colon with stool and gas. Sigmoid colon is distended to 20 cm with stool. Electronically Signed   By: Charlett Nose M.D.   On: 12/30/2023 23:39   DG Abd 1 View Result Date: 12/29/2023 CLINICAL DATA:  Diarrhea  and abdominal distension. EXAM: ABDOMEN - 1 VIEW COMPARISON:  None Available. FINDINGS: Massive colonic stool burden, with suspected massive sigmoid distention with stool, greater than 20 cm. Large volume of stool throughout the remainder of the colon. There is marked with stool distension of the rectum at 10 cm. No small bowel dilatation or evidence of obstruction. IMPRESSION: Marked colonic stool burden with suspected massive sigmoid distention with stool, greater than 20 cm. Large volume of stool throughout the remainder of the colon. Marked stool distension of the rectum at 10 cm. In the setting of diarrhea this may be related to overflow. Consider CT for further assessment. Electronically Signed   By: Narda Rutherford M.D.   On: 12/29/2023 21:37    Procedures Procedures    Medications Ordered in  ED Medications  sodium phosphate (FLEET) enema 1 enema (1 enema Rectal Given 12/31/23 0302)  LORazepam (ATIVAN) injection 1 mg (1 mg Intramuscular Given 12/31/23 0241)    ED Course/ Medical Decision Making/ A&P                                 Medical Decision Making Risk OTC drugs. Prescription drug management. Decision regarding hospitalization.   23 year old well-appearing male presenting for constipation.  Exam notable for a distended but soft and nontender abdomen.  CT scan showed a massively distended sigmoid colon secondary to stool burden but no evidence of perforation or bowel obstruction or intra-abdominal infection.  Labs showed mild hypokalemia but otherwise unremarkable.  Gave him 1 mg of Ativan and administered Fleet enema which she tolerated well.  Prior to the enema he did have what his mother described as 2 occurrences of diarrhea.  Given the severity of his constipation, felt he needed further evaluation inpatient.  Admitted to family medicine service.  He remains well-appearing, nontoxic, no acute distress and hemodynamically stable.        Final Clinical Impression(s) /  ED Diagnoses Final diagnoses:  Constipation, unspecified constipation type    Rx / DC Orders ED Discharge Orders     None         Janalee Mcmurray, PA-C 12/31/23 0410    Alissa April, MD 12/31/23 [phone]

## 2023-12-31 NOTE — ED Notes (Signed)
 Patient's mother states patient had two bowel movements, is currently in restroom cleaning patient. MD notified of patient bowel movement.

## 2023-12-31 NOTE — H&P (Addendum)
 Hospital Admission History and Physical Service Pager: [phone]  Patient name: Brent Yang Medical record number: [id number] Date of Birth: 15-Jun-2001 Age: 23 y.o. Gender: male  Primary Care Provider: Lincoln Brigham, MD Consultants: none Code Status: Full code Preferred Emergency Contact:   Contact Information     Name Relation Home Work [place] Mother [phone]  [phone]      Other Contacts     Name Relation Home Work Mobile   [facility] Grandmother [phone]  [phone]       Chief Complaint: constipation  Assessment and Plan: Brent Yang is a 23 y.o. male presenting with constipation, sent by PCP due to stool burden seen on xray. Differential for presentation of this includes primary versus secondary constipation, infectious etiology.   - given patient history and presentation, this is most likely a secondary constipation related to lifestyle factors including poor fiber intake and medications. Less concern for primary constipation though cannot rule out. - very low concern for constipation/colonic distention of infectious etiology given patient clinically well-appearing.  Assessment & Plan Constipation Chronic problem, and this particular episode has likely been going on for some time. Though colon is severely distended on imaging no evidence of perforation, no clinical signs of infection. Continue to pursue clean-out as below. - Admit to FMTS, attending Dr. McDiarmid - Med surg, Vital signs per floor - NPO at this time to help facilitate bowel clean-out - repeat KUB to evaluate s/p fleet enema in ED - GI pathogen panel - Miralax BID - senna BID - consider additional enema versus manual disimpaction. Consider GI consult Chronic health problem Autism/behavioral disturbances - continue home carbamazepine, mirtazepine, paliperidone   FEN/GI: NPO VTE Prophylaxis: lovenox   Disposition: med surg  History of  Present Illness:  Brent Yang is a 23 y.o. male presenting with constipation.  Since January, has been having abnormal bowel movements. Mom reports they went out to eat and afterwards they had a stomach bug. Devesh's symptoms persisted long after.  In the last few weeks he has been having frequent diarrhea. Several days ago mom was helping Brent Yang bathe and noticed his belly was swollen and hard; she took him for PCP appointment which resulted in KUB revealing his severe constipation. At that time mom was instructed to bring Brent Yang to the ED.  Mom reports constipation has been a lifelong problem; he generally has BMs about once weekly.   No recent fevers. Has been eating/drinking normally. Has not been communicating any pain recently; has been acting like himself.  Mom has not been giving him daily Miralax for many months.  In the ED, Patient underwent CT which showed significant stool burden and colonic distension. Patient given fleet enema, which he tolerated well with ativan per treatment, but had very little stool output.  Review Of Systems: Per HPI.  Pertinent Past Medical History: Autism/nonverbal Remainder reviewed in history tab.   Pertinent Past Surgical History: Dental extractions  Remainder reviewed in history tab.   Pertinent Social History: Tobacco use: No Alcohol use: No Other Substance use: No Lives with mom, sister  Pertinent Family History: None Remainder reviewed in history tab.   Important Outpatient Medications: Amantadine 100 mg BID Carbamazepine 300 mg BID Gabapentin 400 mg TID Mirtazepine 15 mg at bedtime  Paliperidone 9 mg daily Propranolol 40 mg BID Miralax 17g daily - using as needed - most recently a few months ago Cetirizine daily Remainder reviewed in medication history.   Objective: BP 139/89  Pulse 89   Temp 97.9 F (36.6 C) (Axillary)   Resp 18   SpO2 98%  Exam: General: Lying in bed, does not appear uncomfortable, no acute  distress. Does intermittently stand up and groan while appearing to strain, likely BM, then lies down again. HEENT: normocephalic Cardio: Regular rate, regular rhythm, no murmurs on exam. Pulm: Clear, no wheezing, no crackles. No increased work of breathing. Abdominal: bowel sounds present, abdomen is hard and distended. No apparent tenderness on palpation. Extremities: no peripheral edema. Moves all extremities equally.   Labs:  CBC BMET  Recent Labs  Lab 12/30/23 2023  WBC 4.2  HGB 12.6*  HCT 37.2*  PLT 245   Recent Labs  Lab 12/30/23 2023  NA 140  K 3.2*  CL 109  CO2 25  BUN 11  CREATININE 0.92  GLUCOSE 102*  CALCIUM 9.1      4/16 CT A/P: IMPRESSION: Massively distended colon with stool and gas. Sigmoid colon is distended to 20 cm with stool.   Omar Bibber, DO 12/31/2023, 5:13 AM PGY-1, [facility]  FPTS Intern pager: [phone], text pages welcome Secure chat group [facility] Teaching Service     FPTS Upper-Level Resident Addendum   I have independently interviewed and examined the patient. I have discussed the above with Dr. Bernardino Bridge and agree with the documented plan. My edits for correction/addition/clarification are included above. Please see any attending notes.   Edison Gore, MD PGY-2, [facility] 12/31/2023 5:16 AM  FPTS Service pager: [phone] (text pages welcome through [facility])

## 2023-12-31 NOTE — Plan of Care (Signed)
 FMTS Brief Progress Note  S: On interview, patient awakes to voice but cannot communicate. On conversation with patient's mother Nagina Maiers, gave permission to attempt minimal disimpaction with Ativan and fentanyl as necessary.  However, stool was too solid for manual disimpaction.  We will try milk and molasses retention enema.  If this does not work, will consult GI likely tomorrow 4/18 for further recommendations.  O: BP 138/77   Pulse 92   Temp 98 F (36.7 C) (Axillary)   Resp 18   SpO2 99%    Constitutional: NAD, laying on side Cardiovascular: RRR, no M/R/G. Respiratory: CTAB. Abdominal: distended bowel, non-tender, tight, hypoactive bowel sounds MSK: ROM intact.  Neuro: UTA Psych: UTA  A/P: Constipation - Attempt milk and magnesium retention enema -If this does not work, GI consult a.m. 4/18. - Await GI pathogen panel.  - Continue Miralax and senna.  Autism/behavioral disturbances - Continue home medications. - Discontinued mirtazapine as patient appears not to be taking it at home.   Gwyndolyn Lerner, MD 12/31/2023, 8:36 AM PGY-1, [facility] Psychiatry Resident Please page [phone] with questions.

## 2023-12-31 NOTE — Hospital Course (Addendum)
 Brent Yang is a 23 y.o.male with a history of autism spectrum disorder, non-verbal and constipation who was admitted to the [facility] Medicine Teaching Service at [facility] for constipation. His hospital course is detailed below:  Constipation CT AP showed massive stool burden to 20 cm in the sigmoid colon.  Was initially not responding to enemas, and manual disimpaction was unsuccessful.  GI was consulted and recommended continued enemas, Dulcolax suppository, MiraLAX , senna.  Serial KUBs showed gradual improvement at first, but then unfortunately did not show much change; as such GI was reconsulted.  Recommended oral Suprep which patient was unable to tolerate.  Repeat CT scan showed improved stool burden, though colon and rectum still distended possibly suggesting colonic ileus.  General surgery was consulted and did not feel like surgery was indicated at this time.  GI recommended discontinuing enemas and continuing patient on miralax  twice daily, Dulcolax suppositories twice daily, senna twice daily.  Patient did well with this regimen, constipation greatly improved, and patient was ready to discharge home.  Additionally, during hospitalization amantadine  thought to be possibly contributing to constipation.  This was decreased from 100 mg twice daily to 100 mg daily, and then was discontinued at discharge.  APS Involvement APS report was made due to concern that this problem had been going on for many months, unaddressed.  Mom did report to the medical team that she had not been giving daily MiraLAX  for many months.   Other chronic conditions were medically managed with home medications and formulary alternatives as necessary (behavioral disturbance)  PCP Follow-up Recommendations: Follow-up constipation Consider medications and regimen that might contribute to constipation. Per GI, May be a candidate for Motegrity as an outpatient; if started would need to pay attention to mental status and mood  as there can be a rare risk of change in mood.

## 2023-12-31 NOTE — ED Notes (Signed)
 Patient's mother reports very little output post enema, MD notified, MD to follow up.

## 2024-01-01 ENCOUNTER — Inpatient Hospital Stay (HOSPITAL_COMMUNITY): Payer: MEDICAID

## 2024-01-01 DIAGNOSIS — E876 Hypokalemia: Secondary | ICD-10-CM | POA: Diagnosis not present

## 2024-01-01 DIAGNOSIS — K6389 Other specified diseases of intestine: Secondary | ICD-10-CM | POA: Diagnosis not present

## 2024-01-01 DIAGNOSIS — D649 Anemia, unspecified: Secondary | ICD-10-CM | POA: Diagnosis not present

## 2024-01-01 DIAGNOSIS — F840 Autistic disorder: Secondary | ICD-10-CM | POA: Diagnosis not present

## 2024-01-01 DIAGNOSIS — F71 Moderate intellectual disabilities: Secondary | ICD-10-CM | POA: Diagnosis not present

## 2024-01-01 DIAGNOSIS — K5641 Fecal impaction: Secondary | ICD-10-CM | POA: Diagnosis not present

## 2024-01-01 LAB — MAGNESIUM: Magnesium: 1.6 mg/dL — ABNORMAL LOW (ref 1.7–2.4)

## 2024-01-01 LAB — BASIC METABOLIC PANEL WITH GFR
Anion gap: 9 (ref 5–15)
Anion gap: 10 (ref 5–15)
BUN: 9 mg/dL (ref 6–20)
BUN: 9 mg/dL (ref 6–20)
CO2: 25 mmol/L (ref 22–32)
CO2: 27 mmol/L (ref 22–32)
Calcium: 8.9 mg/dL (ref 8.9–10.3)
Calcium: 8.7 mg/dL — ABNORMAL LOW (ref 8.9–10.3)
Chloride: 105 mmol/L (ref 98–111)
Chloride: 104 mmol/L (ref 98–111)
Creatinine, Ser: 0.95 mg/dL (ref 0.61–1.24)
Creatinine, Ser: 0.98 mg/dL (ref 0.61–1.24)
GFR, Estimated: >60 mL/min (ref >60)
GFR, Estimated: >60 mL/min (ref >60)
Glucose, Bld: 110 mg/dL — ABNORMAL HIGH (ref 70–99)
Glucose, Bld: 98 mg/dL (ref 70–99)
Potassium: 2.7 mmol/L — CL (ref 3.5–5.1)
Potassium: 3 mmol/L — ABNORMAL LOW (ref 3.5–5.1)
Sodium: 139 mmol/L (ref 135–145)
Sodium: 141 mmol/L (ref 135–145)

## 2024-01-01 LAB — CELIAC DISEASE COMPREHENSIVE PANEL WITH REFLEXES
IgA/Immunoglobulin A, Serum: 108 mg/dL (ref 90–386)
Transglutaminase IgA: <2 U/mL (ref 0–3)

## 2024-01-01 LAB — CBC WITH DIFFERENTIAL/PLATELET
Basophils Absolute: 0 10*3/uL (ref 0.0–0.2)
Basos: 1 %
EOS (ABSOLUTE): 0.3 10*3/uL (ref 0.0–0.4)
Eos: 6 %
Hematocrit: 38.7 % (ref 37.5–51.0)
Hemoglobin: 12.9 g/dL — ABNORMAL LOW (ref 13.0–17.7)
Immature Grans (Abs): 0 10*3/uL (ref 0.0–0.1)
Immature Granulocytes: 0 %
Lymphocytes Absolute: 1.1 10*3/uL (ref 0.7–3.1)
Lymphs: 25 %
MCH: 32.6 pg (ref 26.6–33.0)
MCHC: 33.3 g/dL (ref 31.5–35.7)
MCV: 98 fL — ABNORMAL HIGH (ref 79–97)
Monocytes Absolute: 0.3 10*3/uL (ref 0.1–0.9)
Monocytes: 7 %
Neutrophils Absolute: 2.6 10*3/uL (ref 1.4–7.0)
Neutrophils: 61 %
Platelets: 246 10*3/uL (ref 150–450)
RBC: 3.96 x10E6/uL — ABNORMAL LOW (ref 4.14–5.80)
RDW: 11.8 % (ref 11.6–15.4)
WBC: 4.3 10*3/uL (ref 3.4–10.8)

## 2024-01-01 LAB — COMPREHENSIVE METABOLIC PANEL WITH GFR
ALT: 26 IU/L (ref 0–44)
AST: 30 IU/L (ref 0–40)
Albumin: 4 g/dL — ABNORMAL LOW (ref 4.3–5.2)
Alkaline Phosphatase: 105 IU/L (ref 44–121)
BUN/Creatinine Ratio: 14 (ref 9–20)
BUN: 13 mg/dL (ref 6–20)
Bilirubin Total: 0.3 mg/dL (ref 0.0–1.2)
CO2: 25 mmol/L (ref 20–29)
Calcium: 9.1 mg/dL (ref 8.7–10.2)
Chloride: 110 mmol/L — ABNORMAL HIGH (ref 96–106)
Creatinine, Ser: 0.93 mg/dL (ref 0.76–1.27)
Globulin, Total: 2.4 g/dL (ref 1.5–4.5)
Glucose: 99 mg/dL (ref 70–99)
Potassium: 3.8 mmol/L (ref 3.5–5.2)
Sodium: 145 mmol/L — ABNORMAL HIGH (ref 134–144)
Total Protein: 6.4 g/dL (ref 6.0–8.5)
eGFR: 118 mL/min/{1.73_m2} (ref >59)

## 2024-01-01 LAB — CBC
HCT: 32.5 % — ABNORMAL LOW (ref 39.0–52.0)
Hemoglobin: 11.4 g/dL — ABNORMAL LOW (ref 13.0–17.0)
MCH: 32.6 pg (ref 26.0–34.0)
MCHC: 35.1 g/dL (ref 30.0–36.0)
MCV: 92.9 fL (ref 80.0–100.0)
Platelets: 240 10*3/uL (ref 150–400)
RBC: 3.5 MIL/uL — ABNORMAL LOW (ref 4.22–5.81)
RDW: 12.2 % (ref 11.5–15.5)
WBC: 6.3 10*3/uL (ref 4.0–10.5)
nRBC: 0 % (ref 0.0–0.2)

## 2024-01-01 LAB — MISC LABCORP TEST (SEND OUT): Labcorp test code: 83935

## 2024-01-01 MED ORDER — POLYETHYLENE GLYCOL 3350 17 G PO PACK
17.0000 g | PACK | Freq: Two times a day (BID) | ORAL | Status: DC
  Administered 2024-01-01: 17 g via ORAL
  Filled 2024-01-01: qty 1

## 2024-01-01 MED ORDER — MAGNESIUM SULFATE 2 GM/50ML IV SOLN
2.0000 g | Freq: Every day | INTRAVENOUS | Status: AC
  Administered 2024-01-01 – 2024-01-03 (×3): 2 g via INTRAVENOUS
  Filled 2024-01-01 (×3): qty 50

## 2024-01-01 MED ORDER — SMOG ENEMA
960.0000 mL | Freq: Two times a day (BID) | RECTAL | Status: DC
  Administered 2024-01-01 (×2): 960 mL via RECTAL
  Filled 2024-01-01 (×4): qty 960

## 2024-01-01 MED ORDER — MAGNESIUM SULFATE IN D5W 1-5 GM/100ML-% IV SOLN
1.0000 g | Freq: Once | INTRAVENOUS | Status: DC
  Filled 2024-01-01: qty 100

## 2024-01-01 MED ORDER — GABAPENTIN 400 MG PO CAPS
400.0000 mg | ORAL_CAPSULE | Freq: Every day | ORAL | Status: DC
  Administered 2024-01-01 – 2024-01-14 (×13): 400 mg via ORAL
  Filled 2024-01-01 (×13): qty 1

## 2024-01-01 MED ORDER — POTASSIUM CHLORIDE 2 MEQ/ML IV SOLN
INTRAVENOUS | Status: AC
  Filled 2024-01-01 (×2): qty 1000

## 2024-01-01 MED ORDER — POTASSIUM CHLORIDE CRYS ER 20 MEQ PO TBCR
40.0000 meq | EXTENDED_RELEASE_TABLET | Freq: Once | ORAL | Status: AC
  Administered 2024-01-01: 40 meq via ORAL
  Filled 2024-01-01: qty 2

## 2024-01-01 MED ORDER — POTASSIUM CHLORIDE 10 MEQ/100ML IV SOLN
10.0000 meq | INTRAVENOUS | Status: AC
  Administered 2024-01-01 (×5): 10 meq via INTRAVENOUS
  Filled 2024-01-01 (×3): qty 100

## 2024-01-01 MED ORDER — POLYETHYLENE GLYCOL 3350 17 G PO PACK
17.0000 g | PACK | Freq: Three times a day (TID) | ORAL | Status: DC
  Administered 2024-01-01 – 2024-01-08 (×28): 17 g via ORAL
  Filled 2024-01-01 (×27): qty 1

## 2024-01-01 MED ORDER — SENNA 8.6 MG PO TABS
2.0000 | ORAL_TABLET | Freq: Two times a day (BID) | ORAL | Status: DC
  Administered 2024-01-01 – 2024-01-15 (×28): 17.2 mg via ORAL
  Filled 2024-01-01 (×28): qty 2

## 2024-01-01 NOTE — Assessment & Plan Note (Addendum)
 Severe colonic distention with no obvious evidence of perforation or infection. Minimal response to fleet, smog, and milk/molasses enema, senna. Unsuccessful manual disimpaction. - GI consulted, appreciate recommendations - f/u GIPP - senna BID - continue NPO

## 2024-01-01 NOTE — Consult Note (Addendum)
 Consultation Note   Referring Provider:  Family Medicine Teaching Service PCP: Elicia Hamlet, MD Primary Gastroenterologist::   Unassigned     Reason for Consultation: Fecal impaction DOA: 12/30/2023         Hospital Day: 3   Attending physician's note  I personally saw the patient and performed a substantive portion of this encounter (>50% time spent), including a complete performance of at least one of the key components (MDM, Hx and/or Exam), in conjunction with the APP.  I agree with the APP's note, impression, and recommendations with additional input as follows.     23 year old male with autism and chronic constipation admitted with worsening constipation and fecal impaction, improving s/p smog enema Abdomen is soft, no tenderness Abdominal x-ray showed decreased dilation in the rectum but has significant amount of retained stool Continue aggressive bowel regimen with enema 3 times daily Clear liquid diet as tolerated  GI is available if needed, please call us  back if have any questions    [name] , MD [phone]     ASSESSMENT    23 y.o. year old male with a medical history including but not limited to autism and chronic constipation who is admitted with fecal impaction   Chronic constipation / severe fecal impaction resulting in significant distention of the sigmoid colon Spoke with patient's RN spoke with patient's RN.  He has had some bowel movements including a large soft one since SMOG enema last night. Patient is nonverbal, no family present.  His abdomen is still mild to moderately distended.   Hypokalemia / low Mg+ level  Mild normocytic anemia  Autism  See PMH for additional history     PLAN:   -- Will give soapsuds enema this a.m. -- Continue twice daily senna -- Add twice daily MiraLAX  -- Add daily Dulcolax suppositories starting in the a.m. -- KUB this afternoon  -- He is going to need an  aggressive home bowel regimen -- Potassium ; Mg+ repletion per primary team  HPI   Brent Yang was admitted yesterday for fecal impaction . CT scan showed a massively distended colon with stool and gas. Sigmoid colon is distended to 20 cm with stool. Despite milk and molasses enema , fleet enema and attempt at manual disimpaction the impaction has not resolved. Follow up KUB yesterday showed persistent, extremely large stool volume  and persistent rectal distention. Last night he was given  a SMOG enema and he is also getting BID Senna  Patient is nonverbal.  Spoke to RN, he has been having bowel movement since the smog enema..  One of the bowel movements was soft and very large.    Labs and Imaging:  Recent Labs    12/29/23 1629 12/30/23 2023 12/30/23 2023 01/01/24 0512  WBC 4.3 4.2  --  6.3  HGB 12.9* 12.6*  --  11.4*  HCT 38.7 37.2*  --  32.5*  MCV 98* 94.9   < > 92.9  PLT 246 245  --  240   < > = values in this interval not displayed.   No results for input(s): FOLATE, VITAMINB12, FERRITIN, TIBC, IRONPCTSAT in the last 72 hours. Recent Labs    12/29/23 1629 12/30/23 2023 01/01/24 9487  NA 145* 140 139  K 3.8 3.2* 2.7*  CL 110* 109 105  CO2 25 25 25   GLUCOSE 99 102* 110*  BUN 13 11 9   CREATININE 0.93 0.92 0.95  CALCIUM 9.1 9.1 8.9   Recent Labs    12/29/23 1629 12/30/23 2023  PROT 6.4 6.6  ALBUMIN 4.0* 3.8  AST 30 31  ALT 26 29  ALKPHOS 105 86  BILITOT 0.3 0.5    Abd 1 View (KUB) CLINICAL DATA:  Constipation.  EXAM: ABDOMEN - 1 VIEW  COMPARISON:  CT scan from 1 day prior.  X-rays from 2 days prior.  FINDINGS: Extremely large stool volume noted, as before. There may have been some minimal decrease in distal colonic stool although rectum remains distended at 10.1 cm today compared to 10.2 cm previously.  IMPRESSION: Extremely large stool volume with some possible minimal decrease in distal colonic stool although rectum remains distended at 10.1  cm today compared to 10.2 cm previously.  Electronically Signed   By: Camellia Candle M.D.   On: 12/31/2023 05:52    Past Medical History:  Diagnosis Date   Allergic rhinitis    Autism    Intellectual delay    Peanut allergy     Past Surgical History:  Procedure Laterality Date   dental extractions     with anesthesia   DENTAL RESTORATION/EXTRACTION WITH X-RAY N/A 09/24/2023   Procedure: XRAY EXAM, FULL MOUTH DEBRIDEMENT, FILLING ON 2O, 14OBL, 15O, 19DOB; EXTRACTION OF TOOTH THREE;  Surgeon: Bonnell Margarito DEL, DMD;  Location: [facility] OR;  Service: Dentistry;  Laterality: N/A;   FMH: unable to obtain  Prior to Admission medications   Medication Sig Start Date End Date Taking? Authorizing Provider  amantadine  (SYMMETREL ) 100 MG capsule GIVE 1 CAPSULE BY MOUTH TWICE A DAY(8AM+4PM) FOR EPS PREVENTION Patient taking differently: Take 100 mg by mouth at bedtime. GIVE 1 CAPSULE BY MOUTH TWICE A DAY(8AM+4PM) FOR EPS PREVENTION 02/06/22  Yes Hagler, Redell, MD  Carbamazepine  (EQUETRO ) 300 MG CP12 Take 1 capsule (300 mg total) by mouth 2 (two) times daily. Patient taking differently: Take 300 mg by mouth at bedtime. 02/06/22  Yes Hagler, Redell, MD  EPINEPHRINE  0.3 mg/0.3 mL IJ SOAJ injection INJECT CONTENTS OF ONE DEVICE, 0.3 ML, INTO MUSCLE IN EVENT OF ANAPHYLAXIS 08/21/20  Yes Taft Jon PARAS, MD  gabapentin  (NEURONTIN ) 400 MG capsule Take 400 mg by mouth at bedtime.   Yes [provider]  paliperidone  (INVEGA ) 9 MG 24 hr tablet TAKE 1 TABLET BY MOUTH DAILY AT 8AM FOR AGGRESSION/MOOD Patient taking differently: Take 9 mg by mouth at bedtime. 02/06/22  Yes Hagler, Redell, MD  polyethylene glycol powder (GLYCOLAX /MIRALAX ) 17 GM/SCOOP powder Mix 1 capful in 8 oz liquid and drink once daily to promote soft stools; decrease dose to 1/2 capful if stools too loose Patient taking differently: Take 17 g by mouth 4 (four) times a week. Mix 1 capful in 8 oz liquid and drink  to promote soft stools; decrease  dose to 1/2 capful if stools too loose 12/24/20  Yes Taft Jon PARAS, MD  propranolol  (INDERAL ) 40 MG tablet Take 1 tablet (40 mg total) by mouth 2 (two) times daily. 02/06/22  Yes Rolinda Redell, MD    Current Facility-Administered Medications  Medication Dose Route Frequency Provider Last Rate Last Admin   amantadine  (SYMMETREL ) capsule 100 mg  100 mg Oral BID Lafe Domino, DO   100 mg at 12/31/23 2222   carbamazepine  (TEGRETOL  XR) 12 hr tablet  300 mg  300 mg Oral BID McDiarmid, Krystal BIRCH, MD   300 mg at 12/31/23 2222   enoxaparin  (LOVENOX ) injection 40 mg  40 mg Subcutaneous Q24H Lafe Domino, DO   40 mg at 12/31/23 9050   magnesium  sulfate IVPB 1 g 100 mL  1 g Intravenous Once Mahmood, Atif, MD       ondansetron  (ZOFRAN ) injection 4 mg  4 mg Intravenous Q6H PRN McDiarmid, Krystal BIRCH, MD   4 mg at 12/31/23 1145   paliperidone  (INVEGA ) 24 hr tablet 9 mg  9 mg Oral Daily Lafe Domino, DO   9 mg at 12/31/23 1108   potassium chloride  10 mEq in 100 mL IVPB  10 mEq Intravenous Q1 Hr x 6 Romelle Booty, MD   Paused at 01/01/24 9349   propranolol  (INDERAL ) tablet 40 mg  40 mg Oral BID Lafe Domino, DO   40 mg at 12/31/23 1059   senna (SENOKOT) tablet 8.6 mg  1 tablet Oral BID Lafe Domino, DO   8.6 mg at 12/31/23 2101    Allergies as of 12/30/2023 - Review Complete 12/30/2023  Allergen Reaction Noted   Peanuts [peanut oil] Other (See Comments) 02/23/2013    Social History   Socioeconomic History   Marital status: Single    Spouse name: Not on file   Number of children: Not on file   Years of education: Not on file   Highest education level: Not on file  Occupational History   Not on file  Tobacco Use   Smoking status: Never    Passive exposure: Never   Smokeless tobacco: Never  Vaping Use   Vaping status: Never Used  Substance and Sexual Activity   Alcohol use: Never   Drug use: Never   Sexual activity: Never    Birth control/protection: Abstinence  Other Topics Concern   Not on  file  Social History Narrative   Not on file   Social Drivers of Health   Financial Resource Strain: Not on file  Food Insecurity: Patient Unable To Answer (12/31/2023)   Hunger Vital Sign    Worried About Running Out of Food in the Last Year: Patient unable to answer    Ran Out of Food in the Last Year: Patient unable to answer  Transportation Needs: Patient Unable To Answer (12/31/2023)   PRAPARE - Transportation    Lack of Transportation (Medical): Patient unable to answer    Lack of Transportation (Non-Medical): Patient unable to answer  Physical Activity: Not on file  Stress: Not on file  Social Connections: Not on file  Intimate Partner Violence: Patient Unable To Answer (12/31/2023)   Humiliation, Afraid, Rape, and Kick questionnaire    Fear of Current or Ex-Partner: Patient unable to answer    Emotionally Abused: Patient unable to answer    Physically Abused: Patient unable to answer    Sexually Abused: Patient unable to answer     Code Status   Code Status: Full Code  Review of Systems: All systems reviewed and negative except where noted in HPI.  Physical Exam: Vital signs in last 24 hours: Temp:  [98 F (36.7 C)-100 F (37.8 C)] 98.6 F (37 C) (04/18 0339) Pulse Rate:  [51-96] 96 (04/18 0339) Resp:  [16-18] 18 (04/18 0339) BP: (90-156)/(56-109) 120/73 (04/18 0339) SpO2:  [98 %-100 %] 99 % (04/18 0339) Last BM Date : 12/31/23  General:  male in NAD Psych:  Cooperative. Normal mood and affect Eyes: Pupils equal Ears:  Normal auditory  acuity Nose: No deformity, discharge or lesions Neck:  Supple, no masses felt Lungs:  Clear to auscultation.  Heart:  Regular rate, regular rhythm.  Abdomen:  Soft, mild-moderately distended. A few bowel sounds. Doesn't appear tender. No masses felt Rectal :  Deferred Msk: Symmetrical without gross deformities.  Neurologic:  Alert, oriented, grossly normal neurologically Skin:  Intact without significant lesions.     Intake/Output from previous day: 04/17 0701 - 04/18 0700 In: -  Out: 100 [Emesis/NG output:100] Intake/Output this shift:  No intake/output data recorded.   Brent Dasen, NP-C   01/01/2024, 8:40 AM   \

## 2024-01-01 NOTE — Progress Notes (Signed)
**Note Brent-Identified via Obfuscation**      Daily Progress Note Intern Pager: [phone]  Patient name: [name] Medical record number: [id number] Date of birth: 12/21/2000 Age: 23 y.o. Gender: male  Primary Care Provider: Albin Huh, MD Consultants: GI Code Status: Full  Pt Overview and Major Events to Date:  4/17 admitted  Assessment and Plan:  Brent Yang is a 23 y.o. male presenting with abdominal distention and fecal impaction in the setting of chronic constipation. Pertinent PMH/PSH includes autism spectrum disorder, moderate intellectual disability.  Assessment & Plan Constipation  Fecal impaction in rectum Severe colonic distention with no obvious evidence of perforation or infection. Minimal response to fleet, smog, and milk/molasses enema, senna. Unsuccessful manual disimpaction. - GI consulted, appreciate recommendations - f/u GIPP - senna BID - continue NPO Hypokalemia 2.7 this morning. Mg 1.6 -IV K and Mg repletion -EKG -PM BMP Chronic health problem Autism/behavioral disturbances - continue home carbamazepine , paliperidone , amantadine , propranolol    FEN/GI: NPO PPx: Lovenox  Dispo: Likely home pending clinical improvement  Subjective:  NAEON, asleep this morning  Objective: Temp:  [98 F (36.7 C)-100 F (37.8 C)] 98.6 F (37 C) (04/18 0339) Pulse Rate:  [51-96] 96 (04/18 0339) Resp:  [16-18] 18 (04/18 0339) BP: (90-156)/(56-109) 120/73 (04/18 0339) SpO2:  [98 %-100 %] 99 % (04/18 0339) Physical Exam: General: NAD Cardiovascular: RRR no murmur Respiratory: CTAB normal WOB on RA Abdomen: Distended, fullness LLQ. No overt tenderness/rebound/guarding though pt nonverbal Extremities: no significant edema  Laboratory: Most recent CBC Lab Results  Component Value Date   WBC 6.3 01/01/2024   HGB 11.4 (L) 01/01/2024   HCT 32.5 (L) 01/01/2024   MCV 92.9 01/01/2024   PLT 240 01/01/2024   Most recent BMP    Latest Ref Rng & Units 01/01/2024    5:12 AM  BMP  Glucose 70  - 99 mg/dL 259   BUN 6 - 20 mg/dL 9   Creatinine 5.63 - 8.75 mg/dL 6.43   Sodium 329 - 518 mmol/L 139   Potassium 3.5 - 5.1 mmol/L 2.7   Chloride 98 - 111 mmol/L 105   CO2 22 - 32 mmol/L 25   Calcium 8.9 - 10.3 mg/dL 8.9     Mg 1.6   Edison Gore, MD 01/01/2024, 6:43 AM  PGY-2, [facility] FPTS Intern pager: [phone], text pages welcome Secure chat group [facility] Teaching Service

## 2024-01-01 NOTE — Assessment & Plan Note (Signed)
 Autism/behavioral disturbances - continue home carbamazepine , paliperidone , amantadine , propranolol 

## 2024-01-01 NOTE — Plan of Care (Signed)

## 2024-01-01 NOTE — Assessment & Plan Note (Signed)
 2.7 this morning. Mg 1.6 -IV K and Mg repletion -EKG -PM BMP

## 2024-01-01 NOTE — TOC CM/SW Note (Addendum)
 Transition of Care [facility]) - Inpatient Brief Assessment   Patient Details  Name: Brent Yang MRN: [id number] Date of Birth: Apr 05, 2001  Transition of Care [facility]) CM/SW Contact:    Juliane Och, LCSW Phone Number: 01/01/2024, 4:36 PM   Clinical Narrative:  4:36 PM CSW attempted to call patient's mother, Creta Dolin, (patient is not fully oriented) to conduct TOC initial assessment. There was no response and a voicemail was left.  Transition of Care Asessment: Insurance and Status: Insurance coverage has been reviewed Patient has primary care physician: Yes Home environment has been reviewed: Private Residence Prior level of function:: N/A Prior/Current Home Services: No current home services Social Drivers of Health Review:  (Patient unable to answer) Readmission risk has been reviewed: Yes Transition of care needs: no transition of care needs at this time

## 2024-01-02 ENCOUNTER — Inpatient Hospital Stay (HOSPITAL_COMMUNITY): Payer: MEDICAID

## 2024-01-02 DIAGNOSIS — K6389 Other specified diseases of intestine: Secondary | ICD-10-CM | POA: Diagnosis not present

## 2024-01-02 DIAGNOSIS — F71 Moderate intellectual disabilities: Secondary | ICD-10-CM | POA: Diagnosis not present

## 2024-01-02 DIAGNOSIS — E876 Hypokalemia: Secondary | ICD-10-CM | POA: Diagnosis not present

## 2024-01-02 DIAGNOSIS — F840 Autistic disorder: Secondary | ICD-10-CM | POA: Diagnosis not present

## 2024-01-02 LAB — BASIC METABOLIC PANEL WITH GFR
Anion gap: 8 (ref 5–15)
BUN: 8 mg/dL (ref 6–20)
CO2: 26 mmol/L (ref 22–32)
Calcium: 8.8 mg/dL — ABNORMAL LOW (ref 8.9–10.3)
Chloride: 107 mmol/L (ref 98–111)
Creatinine, Ser: 0.98 mg/dL (ref 0.61–1.24)
GFR, Estimated: >60 mL/min (ref >60)
Glucose, Bld: 92 mg/dL (ref 70–99)
Potassium: 3.5 mmol/L (ref 3.5–5.1)
Sodium: 141 mmol/L (ref 135–145)

## 2024-01-02 LAB — CBC
HCT: 34.5 % — ABNORMAL LOW (ref 39.0–52.0)
Hemoglobin: 11.8 g/dL — ABNORMAL LOW (ref 13.0–17.0)
MCH: 31.9 pg (ref 26.0–34.0)
MCHC: 34.2 g/dL (ref 30.0–36.0)
MCV: 93.2 fL (ref 80.0–100.0)
Platelets: 237 10*3/uL (ref 150–400)
RBC: 3.7 MIL/uL — ABNORMAL LOW (ref 4.22–5.81)
RDW: 12.1 % (ref 11.5–15.5)
WBC: 5.7 10*3/uL (ref 4.0–10.5)
nRBC: 0 % (ref 0.0–0.2)

## 2024-01-02 LAB — PHOSPHORUS: Phosphorus: 3.3 mg/dL (ref 2.5–4.6)

## 2024-01-02 LAB — MAGNESIUM: Magnesium: 1.8 mg/dL (ref 1.7–2.4)

## 2024-01-02 MED ORDER — SMOG ENEMA
960.0000 mL | Freq: Three times a day (TID) | RECTAL | Status: DC
  Administered 2024-01-02 – 2024-01-05 (×10): 960 mL via RECTAL
  Filled 2024-01-02 (×11): qty 960

## 2024-01-02 MED ORDER — BISACODYL 10 MG RE SUPP
10.0000 mg | Freq: Every day | RECTAL | Status: DC
  Administered 2024-01-02 – 2024-01-13 (×11): 10 mg via RECTAL
  Filled 2024-01-02 (×12): qty 1

## 2024-01-02 NOTE — Assessment & Plan Note (Signed)
 Severe colonic distention with no obvious evidence of perforation or infection.  Unsuccessful manual impaction.  Has had some response to multiple enemas.  Aggressive bowel regimen as below. - Repeat KUB today - Appreciate GI recommendations - Smog enema 3 times daily - Dulcolax suppository daily - MiraLAX  3 times daily - Senna twice daily - f/u GIPP -Clear liquid diet - Please document I/O

## 2024-01-02 NOTE — Plan of Care (Signed)

## 2024-01-02 NOTE — Progress Notes (Signed)
     Daily Progress Note Intern Pager: [phone]  Patient name: Brent Yang Medical record number: [id number] Date of birth: 2001-06-17 Age: 23 y.o. Gender: male  Primary Care Provider: Albin Huh, MD Consultants: GI (signed off) Code Status: Full  Pt Overview and Major Events to Date:  4/17 admitted  Assessment and Plan:  Brent Yang is a 23 y.o. male presenting with abdominal distention and fecal impaction in setting of chronic constipation.  Pertinent PMH includes autism spectrum disorder, moderate intellectual disability. Assessment & Plan Constipation  Fecal impaction in rectum Severe colonic distention with no obvious evidence of perforation or infection.  Unsuccessful manual impaction.  Has had some response to multiple enemas.  Aggressive bowel regimen as below. - Repeat KUB today - Appreciate GI recommendations - Smog enema 3 times daily - Dulcolax suppository daily - MiraLAX  3 times daily - Senna twice daily - f/u GIPP -Clear liquid diet - Please document I/O Hypokalemia Awaiting AM labs  -Monitor BMP, Mg - Replete as appropriate Chronic health problem Autism/behavioral disturbances - continue home carbamazepine , paliperidone , amantadine , propranolol    FEN/GI: Clear liquid PPx: Lovenox  Dispo: Likely home pending clinical improvement  Subjective:  NAEON asleep this morning   Objective: Temp:  [98.1 F (36.7 C)-98.4 F (36.9 C)] 98.1 F (36.7 C) (04/18 2038) Pulse Rate:  [55-96] 55 (04/18 2038) Resp:  [16-18] 18 (04/18 2038) BP: (98-119)/(52-86) 117/86 (04/18 2038) SpO2:  [99 %-100 %] 100 % (04/18 2038) Physical Exam: General: NAD asleep, briefly awakens Cardiovascular: RRR no murmur Respiratory: CTAB normal WOB on RA Abdomen: Soft, mildly distended but greatly improved from prior exam.  No overt tenderness/rebound/guarding Extremities: No significant edema  Laboratory: Most recent CBC Lab Results  Component Value Date   WBC 6.3  01/01/2024   HGB 11.4 (L) 01/01/2024   HCT 32.5 (L) 01/01/2024   MCV 92.9 01/01/2024   PLT 240 01/01/2024   Most recent BMP    Latest Ref Rng & Units 01/01/2024    2:21 PM  BMP  Glucose 70 - 99 mg/dL 98   BUN 6 - 20 mg/dL 9   Creatinine 0.27 - 2.53 mg/dL 6.64   Sodium 403 - 474 mmol/L 141   Potassium 3.5 - 5.1 mmol/L 3.0   Chloride 98 - 111 mmol/L 104   CO2 22 - 32 mmol/L 27   Calcium 8.9 - 10.3 mg/dL 8.7      Edison Gore, MD 01/02/2024, 5:22 AM  PGY-2, [facility] FPTS Intern pager: [phone], text pages welcome Secure chat group [facility] Teaching Service

## 2024-01-02 NOTE — Assessment & Plan Note (Signed)
 Autism/behavioral disturbances - continue home carbamazepine , paliperidone , amantadine , propranolol 

## 2024-01-02 NOTE — Assessment & Plan Note (Addendum)
 Awaiting AM labs  -Monitor BMP, Mg - Replete as appropriate

## 2024-01-03 ENCOUNTER — Inpatient Hospital Stay (HOSPITAL_COMMUNITY): Payer: MEDICAID

## 2024-01-03 DIAGNOSIS — K6389 Other specified diseases of intestine: Secondary | ICD-10-CM | POA: Diagnosis not present

## 2024-01-03 LAB — GASTROINTESTINAL PANEL BY PCR, STOOL (REPLACES STOOL CULTURE)

## 2024-01-03 LAB — CBC
HCT: 36.5 % — ABNORMAL LOW (ref 39.0–52.0)
Hemoglobin: 12.4 g/dL — ABNORMAL LOW (ref 13.0–17.0)
MCH: 31.8 pg (ref 26.0–34.0)
MCHC: 34 g/dL (ref 30.0–36.0)
MCV: 93.6 fL (ref 80.0–100.0)
Platelets: 251 10*3/uL (ref 150–400)
RBC: 3.9 MIL/uL — ABNORMAL LOW (ref 4.22–5.81)
RDW: 12.1 % (ref 11.5–15.5)
WBC: 4.7 10*3/uL (ref 4.0–10.5)
nRBC: 0 % (ref 0.0–0.2)

## 2024-01-03 LAB — BASIC METABOLIC PANEL WITH GFR
Anion gap: 8 (ref 5–15)
BUN: 6 mg/dL (ref 6–20)
CO2: 25 mmol/L (ref 22–32)
Calcium: 8.8 mg/dL — ABNORMAL LOW (ref 8.9–10.3)
Chloride: 107 mmol/L (ref 98–111)
Creatinine, Ser: 1.03 mg/dL (ref 0.61–1.24)
GFR, Estimated: >60 mL/min (ref >60)
Glucose, Bld: 135 mg/dL — ABNORMAL HIGH (ref 70–99)
Potassium: 3.5 mmol/L (ref 3.5–5.1)
Sodium: 140 mmol/L (ref 135–145)

## 2024-01-03 LAB — MAGNESIUM: Magnesium: 2.3 mg/dL (ref 1.7–2.4)

## 2024-01-03 LAB — PHOSPHORUS: Phosphorus: 3.3 mg/dL (ref 2.5–4.6)

## 2024-01-03 NOTE — Plan of Care (Signed)

## 2024-01-03 NOTE — Progress Notes (Signed)
     Daily Progress Note Intern Pager: [phone]  Patient name: Brent Yang Medical record number: [id number] Date of birth: Jul 22, 2001 Age: 23 y.o. Gender: male  Primary Care Provider: Albin Huh, MD Consultants: GI: Signed off Code Status: Full code  Pt Overview and Major Events to Date:  4/17 admitted  Assessment and Plan:  Brent Yang is a 23 y.o. male here for chronic constipation complicated by abdominal distention and fecal compaction. Pertinent PMH/PSH includes autism spectrum disorder and moderate intellectual disability.   Successful enema this morning.  Normal abdominal exam without tenderness.  Continue aggressive bowel regimen. Assessment & Plan Constipation  Fecal impaction in rectum Severe colonic distention with no obvious evidence of perforation or infection.  GI pathogen panel negative for infection.  Unsuccessful manual impaction.  Has had some response to multiple enemas.  Aggressive bowel regimen as below. - Smog enema 3 times daily - Dulcolax suppository daily - MiraLAX  3 times daily - Senna twice daily - Clear liquid diet - Please document I/O Hypokalemia Improved.  Potassium 3.5 yesterday.  Will await morning labs - Monitor BMP, Mg - Replete as appropriate Chronic health problem Autism/behavioral disturbances - continue home carbamazepine , paliperidone , amantadine , propranolol     FEN/GI: Clear liquid PPx: Lovenox  Dispo:Home pending clinical improvement . Barriers include ongoing medical management.   Subjective:  Awake, alert.  No acute distress.  Smiles when spoken to.  Objective: Temp:  [97.5 F (36.4 C)-98.1 F (36.7 C)] 97.9 F (36.6 C) (04/20 0453) Pulse Rate:  [63-87] 80 (04/20 0453) Resp:  [16-18] 17 (04/20 0453) BP: (89-121)/(58-87) 96/64 (04/20 0453) SpO2:  [99 %-100 %] 99 % (04/20 0453) Physical Exam: General: well appearing, no acute distress CV: regular rate, regular rhythm, no murmurs on exam  Pulm: clear, no  wheezing, no increased work of breathing  Abd: soft, non-tender, non-distended  Skin: warm, dry Ext: moves all four spontaneously, good tone   Laboratory: Most recent CBC Lab Results  Component Value Date   WBC 5.7 01/02/2024   HGB 11.8 (L) 01/02/2024   HCT 34.5 (L) 01/02/2024   MCV 93.2 01/02/2024   PLT 237 01/02/2024   Most recent BMP    Latest Ref Rng & Units 01/02/2024    8:42 AM  BMP  Glucose 70 - 99 mg/dL 92   BUN 6 - 20 mg/dL 8   Creatinine 1.47 - 8.29 mg/dL 5.62   Sodium 130 - 865 mmol/L 141   Potassium 3.5 - 5.1 mmol/L 3.5   Chloride 98 - 111 mmol/L 107   CO2 22 - 32 mmol/L 26   Calcium 8.9 - 10.3 mg/dL 8.8     Other pertinent labs GI pathogen panel negative  Imaging/Diagnostic Tests: KUB: Similar appearance to prior imaging studies with massive amount of fecal matter within the colon particularly throughout the rectosigmoid region  Clem Currier, DO 01/03/2024, 5:33 AM  PGY-2, [facility] FPTS Intern pager: [phone], text pages welcome Secure chat group [facility] Teaching Service

## 2024-01-03 NOTE — Assessment & Plan Note (Signed)
 Improved.  Potassium 3.5 yesterday.  Will await morning labs - Monitor BMP, Mg - Replete as appropriate

## 2024-01-03 NOTE — Assessment & Plan Note (Signed)
 Severe colonic distention with no obvious evidence of perforation or infection.  GI pathogen panel negative for infection.  Unsuccessful manual impaction.  Has had some response to multiple enemas.  Aggressive bowel regimen as below. - Smog enema 3 times daily - Dulcolax suppository daily - MiraLAX  3 times daily - Senna twice daily - Clear liquid diet - Please document I/O

## 2024-01-03 NOTE — Assessment & Plan Note (Signed)
 Autism/behavioral disturbances - continue home carbamazepine , paliperidone , amantadine , propranolol 

## 2024-01-04 ENCOUNTER — Inpatient Hospital Stay (HOSPITAL_COMMUNITY): Payer: MEDICAID

## 2024-01-04 DIAGNOSIS — F840 Autistic disorder: Secondary | ICD-10-CM | POA: Diagnosis not present

## 2024-01-04 DIAGNOSIS — K6389 Other specified diseases of intestine: Secondary | ICD-10-CM | POA: Diagnosis not present

## 2024-01-04 LAB — BASIC METABOLIC PANEL WITH GFR
Anion gap: 8 (ref 5–15)
BUN: <5 mg/dL — ABNORMAL LOW (ref 6–20)
CO2: 30 mmol/L (ref 22–32)
Calcium: 9.4 mg/dL (ref 8.9–10.3)
Chloride: 106 mmol/L (ref 98–111)
Creatinine, Ser: 1.21 mg/dL (ref 0.61–1.24)
GFR, Estimated: >60 mL/min (ref >60)
Glucose, Bld: 101 mg/dL — ABNORMAL HIGH (ref 70–99)
Potassium: 3.5 mmol/L (ref 3.5–5.1)
Sodium: 144 mmol/L (ref 135–145)

## 2024-01-04 LAB — MAGNESIUM: Magnesium: 2.3 mg/dL (ref 1.7–2.4)

## 2024-01-04 LAB — PHOSPHORUS: Phosphorus: 4.5 mg/dL (ref 2.5–4.6)

## 2024-01-04 MED ORDER — CARBAMAZEPINE ER 100 MG PO TB12
300.0000 mg | ORAL_TABLET | Freq: Two times a day (BID) | ORAL | Status: DC
  Administered 2024-01-04 – 2024-01-15 (×21): 300 mg via ORAL
  Filled 2024-01-04 (×23): qty 3

## 2024-01-04 NOTE — Progress Notes (Signed)
     Daily Progress Note Intern Pager: [phone]  Patient name: Brent Yang Medical record number: [id number] Date of birth: 10-09-2000 Age: 23 y.o. Gender: male  Primary Care Provider: Albin Huh, MD Consultants: GI (signed off) Code Status: Full  Pt Overview and Major Events to Date:  4/17-admitted  Assessment and Plan: Brent Yang is a 23 year old male admitted for chronic constipation complicated by abdominal distention and fecal compaction.  PMH includes autism spectrum disorder and moderate intellectual disability.  Assessment & Plan Constipation  Fecal impaction in rectum Severe colonic distention with no obvious evidence of perforation or infection. Unsuccessful manual disimpaction.  Has had some response to multiple enemas.  Aggressive bowel regimen as below. Abdomen softer today. - SMOG enema 3 times daily - Dulcolax suppository daily - MiraLAX  3 times daily - Senna twice daily - Clear liquid diet - Strict I/O - repeat rectal exam Hypokalemia Resolved.  K stable at 3.5. Magnesium  2.3. - Monitor BMP, Mg - Replete as appropriate Chronic health problem Autism/behavioral disturbances - continue home carbamazepine , paliperidone , amantadine , propranolol   FEN/GI: Clear liquid diet PPx: Lovenox  Dispo:Home pending clinical improvement .   Subjective:  Patient seen this morning lying in bed.  No family present.  Patient appears comfortable and is cooperative with exam.  Objective: Temp:  [97.6 F (36.4 C)-98.7 F (37.1 C)] 97.6 F (36.4 C) (04/21 0757) Pulse Rate:  [70-103] 74 (04/21 0757) Resp:  [17-18] 18 (04/21 0757) BP: (87-115)/(52-87) 115/87 (04/21 0757) SpO2:  [98 %-100 %] 100 % (04/21 0757) Physical Exam: General: Lying in bed, NAD. Cardiovascular: RRR Respiratory: CTA.  Normal work of breathing. Abdomen: Cannot auscultate bowel sounds due to patient verbalizing during exam, however abdomen is much softer on palpation than my prior  exams. Extremities: Moves all equally.  Laboratory: Most recent CBC Lab Results  Component Value Date   WBC 4.7 01/03/2024   HGB 12.4 (L) 01/03/2024   HCT 36.5 (L) 01/03/2024   MCV 93.6 01/03/2024   PLT 251 01/03/2024   Most recent BMP    Latest Ref Rng & Units 01/04/2024    6:20 AM  BMP  Glucose 70 - 99 mg/dL 147   BUN 6 - 20 mg/dL <5   Creatinine 8.29 - 1.24 mg/dL 5.62   Sodium 130 - 865 mmol/L 144   Potassium 3.5 - 5.1 mmol/L 3.5   Chloride 98 - 111 mmol/L 106   CO2 22 - 32 mmol/L 30   Calcium 8.9 - 10.3 mg/dL 9.4    7/84 KUB: IMPRESSION: 1. Overall decrease in sigmoid colon dilatation and stool. 2. Interval mild-to-moderate right colon and splenic flexure dilatation with a moderate amount of stool in the right colon and hepatic flexure and mild-to-moderate amount of stool in the transverse colon. 3. The overall pattern is compatible with colonic ileus.   Omar Bibber, DO 01/04/2024, 9:26 AM  PGY-1, [facility] Health Family Medicine FPTS Intern pager: [phone], text pages welcome Secure chat group [facility] Teaching Service

## 2024-01-04 NOTE — Plan of Care (Signed)

## 2024-01-04 NOTE — Care Plan (Signed)
 Went to bedside with Dr. Fredrik Jensen to perform rectal exam and possible fecal disimpaction.  Mother present for procedure; reason for procedure fully explained, all questions answered.  On exam, rectal vault without stool. No disimpaction performed.  KUB from this morning does show moderate amount of stool still in the colon, however given that no stool ball felt in the rectum it is likely that combination of enemas and oral agents have been effective.  - continue SMOG enemas TID - continue MiraLax , senna, dulcolax suppository - repeat KUB tomorrow AM

## 2024-01-04 NOTE — Assessment & Plan Note (Addendum)
 Autism/behavioral disturbances - continue home carbamazepine , paliperidone , amantadine , propranolol 

## 2024-01-04 NOTE — Assessment & Plan Note (Addendum)
 Resolved.  K stable at 3.5. Magnesium  2.3. - Monitor BMP, Mg - Replete as appropriate

## 2024-01-04 NOTE — TOC Initial Note (Signed)
 Transition of Care [facility]) - Initial/Assessment Note    Patient Details  Name: Brent Yang MRN: [id number] Date of Birth: [birth year]  [facility]) CM/SW Contact:    Juliane Och, LCSW Phone Number: 01/04/2024, 10:15 AM  Clinical Narrative:                  10:15 AM CSW called patient's mother, legal guardian, Creta Dolin (mother was not at patient's bedside). CSW introduced self and role to Nagina. Creta Dolin confirmed she resides with patient and would be able to provide transportation for discharge and medical/non-medical appointments upon hospitalization. Nagina declined SNF/HH/DME history. No TOC needs were identified at this time.  Expected Discharge Plan: Home/Self Care Barriers to Discharge: Continued Medical Work up   Patient Goals and CMS Choice            Expected Discharge Plan and Services       Living arrangements for the past 2 months: Single Family Home                                      Prior Living Arrangements/Services Living arrangements for the past 2 months: Single Family Home Lives with:: Parents Patient language and need for interpreter reviewed:: Yes        Need for Family Participation in Patient Care: Yes (Comment) Care giver support system in place?: Yes (comment)   Criminal Activity/Legal Involvement Pertinent to Current Situation/Hospitalization: No - Comment as needed  Activities of Daily Living   ADL Screening (condition at time of admission) Independently performs ADLs?: No Does the patient have a NEW difficulty with bathing/dressing/toileting/self-feeding that is expected to last >3 days?: No Does the patient have a NEW difficulty with getting in/out of bed, walking, or climbing stairs that is expected to last >3 days?: No Does the patient have a NEW difficulty with communication that is expected to last >3 days?: No Is the patient deaf or have difficulty hearing?: No Does the patient have difficulty seeing,  even when wearing glasses/contacts?: No Does the patient have difficulty concentrating, remembering, or making decisions?: Yes  Permission Sought/Granted Permission sought to share information with : Family Supports Permission granted to share information with : No (Contact information on chart)  Share Information with NAME: Nagina Farrey     Permission granted to share info w Relationship: Mother  Permission granted to share info w Contact Information: [phone]  Emotional Assessment Appearance:: Appears stated age Attitude/Demeanor/Rapport: Unable to Assess Affect (typically observed): Unable to Assess Orientation: : Oriented to Self Alcohol / Substance Use: Not Applicable Psych Involvement: No (comment)  Admission diagnosis:  Constipation [K59.00] Constipation, unspecified constipation type [K59.00] Patient Active Problem List   Diagnosis Date Noted   Hypokalemia 01/01/2024   Colon distention 01/01/2024   Chronic health problem 12/31/2023   Fecal impaction in rectum (HCC) 12/31/2023   Constipation due to slow transit 12/31/2023   Moderate intellectual disability 06/08/2014   Allergic rhinitis 06/13/2013   Constipation  Fecal impaction in rectum 02/24/2013   Autism spectrum disorder 02/23/2013   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   PCP:  Albin Huh, MD Pharmacy:   [facility] [phone] - [place], [place] - [address] AT [facility] [address] [place] [address] Phone: [phone] Fax: [phone]  [id number] [place], [place] - [address]  [place] [address] Phone: [phone] Fax: [phone]     Social Drivers of Health (SDOH) Social History: SDOH Screenings   Food Insecurity: Patient Unable To Answer (12/31/2023)  Housing: Patient Unable To Answer (12/31/2023)  Transportation Needs: Patient Unable To Answer (12/31/2023)  Utilities:  Patient Unable To Answer (12/31/2023)  Depression (PHQ2-9): Medium Risk (03/09/2023)  Tobacco Use: Low Risk  (12/30/2023)   SDOH Interventions:     Readmission Risk Interventions     No data to display

## 2024-01-04 NOTE — Assessment & Plan Note (Addendum)
 Severe colonic distention with no obvious evidence of perforation or infection. Unsuccessful manual disimpaction.  Has had some response to multiple enemas.  Aggressive bowel regimen as below. Abdomen softer today. - SMOG enema 3 times daily - Dulcolax suppository daily - MiraLAX  3 times daily - Senna twice daily - Clear liquid diet - Strict I/O - repeat rectal exam

## 2024-01-05 ENCOUNTER — Inpatient Hospital Stay (HOSPITAL_COMMUNITY): Payer: MEDICAID

## 2024-01-05 DIAGNOSIS — F840 Autistic disorder: Secondary | ICD-10-CM | POA: Diagnosis not present

## 2024-01-05 DIAGNOSIS — F71 Moderate intellectual disabilities: Secondary | ICD-10-CM | POA: Diagnosis not present

## 2024-01-05 DIAGNOSIS — K6389 Other specified diseases of intestine: Secondary | ICD-10-CM | POA: Diagnosis not present

## 2024-01-05 LAB — BASIC METABOLIC PANEL WITH GFR
Anion gap: 6 (ref 5–15)
BUN: 6 mg/dL (ref 6–20)
CO2: 32 mmol/L (ref 22–32)
Calcium: 9.2 mg/dL (ref 8.9–10.3)
Chloride: 105 mmol/L (ref 98–111)
Creatinine, Ser: 1.46 mg/dL — ABNORMAL HIGH (ref 0.61–1.24)
GFR, Estimated: >60 mL/min (ref >60)
Glucose, Bld: 103 mg/dL — ABNORMAL HIGH (ref 70–99)
Potassium: 3.2 mmol/L — ABNORMAL LOW (ref 3.5–5.1)
Sodium: 143 mmol/L (ref 135–145)

## 2024-01-05 LAB — PHOSPHORUS: Phosphorus: 5 mg/dL — ABNORMAL HIGH (ref 2.5–4.6)

## 2024-01-05 LAB — MAGNESIUM: Magnesium: 2.4 mg/dL (ref 1.7–2.4)

## 2024-01-05 MED ORDER — MILK AND MOLASSES ENEMA
1.0000 | Freq: Three times a day (TID) | RECTAL | Status: DC
  Administered 2024-01-05 (×2): 240 mL via RECTAL
  Filled 2024-01-05 (×4): qty 240

## 2024-01-05 MED ORDER — MILK AND MOLASSES ENEMA
1.0000 | Freq: Once | RECTAL | Status: AC
  Administered 2024-01-05: 240 mL via RECTAL
  Filled 2024-01-05: qty 240

## 2024-01-05 MED ORDER — POTASSIUM CHLORIDE CRYS ER 20 MEQ PO TBCR
40.0000 meq | EXTENDED_RELEASE_TABLET | Freq: Once | ORAL | Status: AC
  Administered 2024-01-05: 40 meq via ORAL
  Filled 2024-01-05: qty 2

## 2024-01-05 NOTE — Assessment & Plan Note (Signed)
 K 3.2 this AM. Repleted. - Monitor BMP, Mg - Replete as appropriate

## 2024-01-05 NOTE — Plan of Care (Signed)

## 2024-01-05 NOTE — Progress Notes (Signed)
     Daily Progress Note Intern Pager: [phone]  Patient name: Brent Yang Medical record number: [id number] Date of birth: July 22, 2001 Age: 23 y.o. Gender: male  Primary Care Provider: Albin Huh, MD Consultants: GI (signed off) Code Status: Full  Pt Overview and Major Events to Date:  4/17-admitted  Assessment and Plan: Brent Yang is a 23 year old male with PMH of autism spectrum disorder and moderate intellectual disability, admitted for chronic constipation complicated by abdominal distention and fecal compaction.  Assessment & Plan Constipation  Fecal impaction in rectum Severe colonic distention with no obvious evidence of perforation or infection.  Digital exam yesterday 4/21 without any stool in the rectal vault.  KUB this morning shows improvement in stool burden.  Aggressive bowel regimen as below. Abdomen softer today.  Phosphorus elevated this to 5.0; likely in the setting of smog enemas.  - Discontinue SMOG enema due to rising phosphorus; instead will initiate milk and molasses enema TID - Dulcolax suppository daily - MiraLAX  3 times daily - Senna twice daily - Clear liquid diet - Strict I/O Hypokalemia K 3.2 this AM. Repleted. - Monitor BMP, Mg - Replete as appropriate Chronic health problem Autism/behavioral disturbances - continue home carbamazepine , paliperidone , amantadine , propranolol    FEN/GI: Regular diet PPx: Lovenox  Dispo:Home pending clinical improvement .  Subjective:  Patient seen resting in bed, awakens easily to verbal stimuli.  No family present.  Patient does appear comfortable.  Cooperative with abdominal exam.  Objective: Temp:  [97.6 F (36.4 C)-97.7 F (36.5 C)] 97.6 F (36.4 C) (04/22 0806) Pulse Rate:  [63-85] 74 (04/22 0806) Resp:  [16-18] 18 (04/22 0806) BP: (85-122)/(57-87) 115/87 (04/22 0806) SpO2:  [99 %-100 %] 100 % (04/22 0806) Physical Exam: General: Sleeping, awakens appropriately to verbal  stimuli Cardiovascular: RRR Respiratory: Normal work of breathing on room air Abdomen: Soft Extremities: Moves all equally  Laboratory: Most recent CBC Lab Results  Component Value Date   WBC 4.7 01/03/2024   HGB 12.4 (L) 01/03/2024   HCT 36.5 (L) 01/03/2024   MCV 93.6 01/03/2024   PLT 251 01/03/2024   Most recent BMP    Latest Ref Rng & Units 01/05/2024    7:10 AM  BMP  Glucose 70 - 99 mg/dL 147   BUN 6 - 20 mg/dL 6   Creatinine 8.29 - 5.62 mg/dL 1.30   Sodium 865 - 784 mmol/L 143   Potassium 3.5 - 5.1 mmol/L 3.2   Chloride 98 - 111 mmol/L 105   CO2 22 - 32 mmol/L 32   Calcium 8.9 - 10.3 mg/dL 9.2     6/96 KUB: IMPRESSION: Decrease in the degree of fecal material within the colon.  Omar Bibber, DO 01/05/2024, 8:17 AM  PGY-1, [facility] Health Family Medicine FPTS Intern pager: [phone], text pages welcome Secure chat group [facility] Teaching Service

## 2024-01-05 NOTE — Assessment & Plan Note (Signed)
 Autism/behavioral disturbances - continue home carbamazepine , paliperidone , amantadine , propranolol 

## 2024-01-05 NOTE — Assessment & Plan Note (Addendum)
 Severe colonic distention with no obvious evidence of perforation or infection.  Digital exam yesterday 4/21 without any stool in the rectal vault.  KUB this morning shows improvement in stool burden.  Aggressive bowel regimen as below. Abdomen softer today.  Phosphorus elevated this to 5.0; likely in the setting of smog enemas.  - Discontinue SMOG enema due to rising phosphorus; instead will initiate milk and molasses enema TID - Dulcolax suppository daily - MiraLAX  3 times daily - Senna twice daily - Clear liquid diet - Strict I/O

## 2024-01-06 ENCOUNTER — Other Ambulatory Visit (HOSPITAL_COMMUNITY): Payer: Self-pay

## 2024-01-06 ENCOUNTER — Inpatient Hospital Stay (HOSPITAL_COMMUNITY): Payer: MEDICAID

## 2024-01-06 DIAGNOSIS — F71 Moderate intellectual disabilities: Secondary | ICD-10-CM | POA: Diagnosis not present

## 2024-01-06 DIAGNOSIS — K5641 Fecal impaction: Principal | ICD-10-CM

## 2024-01-06 DIAGNOSIS — K6389 Other specified diseases of intestine: Secondary | ICD-10-CM | POA: Diagnosis not present

## 2024-01-06 DIAGNOSIS — F840 Autistic disorder: Secondary | ICD-10-CM | POA: Diagnosis not present

## 2024-01-06 LAB — BASIC METABOLIC PANEL WITH GFR
Anion gap: 10 (ref 5–15)
BUN: <5 mg/dL — ABNORMAL LOW (ref 6–20)
CO2: 28 mmol/L (ref 22–32)
Calcium: 8.8 mg/dL — ABNORMAL LOW (ref 8.9–10.3)
Chloride: 103 mmol/L (ref 98–111)
Creatinine, Ser: 1.12 mg/dL (ref 0.61–1.24)
GFR, Estimated: >60 mL/min (ref >60)
Glucose, Bld: 92 mg/dL (ref 70–99)
Potassium: 3.4 mmol/L — ABNORMAL LOW (ref 3.5–5.1)
Sodium: 141 mmol/L (ref 135–145)

## 2024-01-06 LAB — PHOSPHORUS: Phosphorus: 4.1 mg/dL (ref 2.5–4.6)

## 2024-01-06 MED ORDER — ENOXAPARIN SODIUM 40 MG/0.4ML IJ SOSY
40.0000 mg | PREFILLED_SYRINGE | Freq: Every day | INTRAMUSCULAR | Status: DC
  Administered 2024-01-06 – 2024-01-15 (×10): 40 mg via SUBCUTANEOUS
  Filled 2024-01-06 (×10): qty 0.4

## 2024-01-06 MED ORDER — POTASSIUM CHLORIDE CRYS ER 20 MEQ PO TBCR
40.0000 meq | EXTENDED_RELEASE_TABLET | Freq: Once | ORAL | Status: AC
  Administered 2024-01-06: 40 meq via ORAL
  Filled 2024-01-06: qty 2

## 2024-01-06 MED ORDER — SMOG ENEMA
960.0000 mL | Freq: Three times a day (TID) | RECTAL | Status: DC
Start: 2024-01-06 — End: 2024-01-11
  Administered 2024-01-06 – 2024-01-11 (×13): 960 mL via RECTAL
  Filled 2024-01-06 (×19): qty 960

## 2024-01-06 NOTE — Plan of Care (Signed)
  Problem: Clinical Measurements: Goal: Ability to maintain clinical measurements within normal limits will improve Outcome: Progressing Goal: Will remain free from infection Outcome: Progressing Goal: Diagnostic test results will improve Outcome: Progressing   Problem: Activity: Goal: Risk for activity intolerance will decrease Outcome: Progressing   Problem: Elimination: Goal: Will not experience complications related to bowel motility Outcome: Progressing   Problem: Nutrition: Goal: Adequate nutrition will be maintained Outcome: Adequate for Discharge

## 2024-01-06 NOTE — Assessment & Plan Note (Addendum)
 Severe colonic distention with no obvious evidence of perforation or infection.  Yesterday switched smog enemas to milk and molasses enemas.  Phosphorus today WNL.  KUB this morning with increased stool burden, however on exam abdomen is significantly softer than any of my prior exams. - Restart SMOG enema due to poor response to milk and molasses enema on KUB this morning. - repeat KUB tomorrow AM - Dulcolax suppository daily - MiraLAX  3 times daily - Senna twice daily - Strict I/O

## 2024-01-06 NOTE — Assessment & Plan Note (Signed)
 Autism/behavioral disturbances - continue home carbamazepine , paliperidone , amantadine , propranolol 

## 2024-01-06 NOTE — Progress Notes (Addendum)
     Daily Progress Note Intern Pager: [phone]  Patient name: Brent Yang Medical record number: [id number] Date of birth: 2001-02-01 Age: 23 y.o. Gender: male  Primary Care Provider: Albin Huh, MD Consultants: GI (signed off) Code Status: Full  Pt Overview and Major Events to Date:  4/17-admitted  Assessment and Plan: Kol D Haen is a 23 year old male with PMH of autism spectrum disorder and moderate intellectual disability, admitted for chronic constipation complicated by abdominal distention and fecal compaction. Assessment & Plan Constipation  Fecal impaction in rectum Severe colonic distention with no obvious evidence of perforation or infection.  Yesterday switched smog enemas to milk and molasses enemas.  Phosphorus today WNL.  KUB this morning with increased stool burden, however on exam abdomen is significantly softer than any of my prior exams. - Restart SMOG enema due to poor response to milk and molasses enema on KUB this morning. - repeat KUB tomorrow AM - Dulcolax suppository daily - MiraLAX  3 times daily - Senna twice daily - Strict I/O Hypokalemia K 3.2 this AM. Repleted. - Monitor BMP, Mg - Replete as appropriate Chronic health problem Autism/behavioral disturbances - continue home carbamazepine , paliperidone , amantadine , propranolol   FEN/GI: Regular diet PPx: Lovenox  Dispo:Home in 2-3 days. Barriers include continued clinical improvement/reduced stool burden.   Subjective:  Patient seen this morning sitting up in bed eating breakfast.  He does look very comfortable and happy.  He is very compliant with exam and does not appear to have any pain.  Spoke with mom on phone; she is unsure about her comfort level with giving home enemas. She will think about it.  Objective: Temp:  [97.6 F (36.4 C)-98.1 F (36.7 C)] 98.1 F (36.7 C) (04/22 1921) Pulse Rate:  [74-85] 85 (04/22 1921) Resp:  [16-18] 16 (04/22 1921) BP: (97-115)/(58-87) 97/58  (04/22 1921) SpO2:  [99 %-100 %] 99 % (04/22 1921) Physical Exam: General: Well-appearing, sitting up in bed eating breakfast Cardiovascular: Overall warm and well-perfused Respiratory: Normal work of breathing on room air Abdomen: Soft, no apparent tenderness Extremities: Moves all equally  Laboratory: Most recent CBC Lab Results  Component Value Date   WBC 4.7 01/03/2024   HGB 12.4 (L) 01/03/2024   HCT 36.5 (L) 01/03/2024   MCV 93.6 01/03/2024   PLT 251 01/03/2024   Most recent BMP    Latest Ref Rng & Units 01/06/2024    5:01 AM  BMP  Glucose 70 - 99 mg/dL 92   BUN 6 - 20 mg/dL <5   Creatinine 1.47 - 1.24 mg/dL 8.29   Sodium 562 - 130 mmol/L 141   Potassium 3.5 - 5.1 mmol/L 3.4   Chloride 98 - 111 mmol/L 103   CO2 22 - 32 mmol/L 28   Calcium 8.9 - 10.3 mg/dL 8.8     8/65 KUB: IMPRESSION: Increased stool burden within the ascending colon.  Omar Bibber, DO 01/06/2024, 7:16 AM  PGY-1, [facility] Family Medicine FPTS Intern pager: [phone], text pages welcome Secure chat group [facility] Teaching Service

## 2024-01-06 NOTE — Assessment & Plan Note (Signed)
 K 3.2 this AM. Repleted. - Monitor BMP, Mg - Replete as appropriate

## 2024-01-07 ENCOUNTER — Inpatient Hospital Stay (HOSPITAL_COMMUNITY): Payer: MEDICAID

## 2024-01-07 DIAGNOSIS — K5641 Fecal impaction: Secondary | ICD-10-CM | POA: Diagnosis not present

## 2024-01-07 LAB — BASIC METABOLIC PANEL WITH GFR
Anion gap: 8 (ref 5–15)
BUN: 8 mg/dL (ref 6–20)
CO2: 28 mmol/L (ref 22–32)
Calcium: 8.7 mg/dL — ABNORMAL LOW (ref 8.9–10.3)
Chloride: 103 mmol/L (ref 98–111)
Creatinine, Ser: 1.19 mg/dL (ref 0.61–1.24)
GFR, Estimated: >60 mL/min (ref >60)
Glucose, Bld: 98 mg/dL (ref 70–99)
Potassium: 3.5 mmol/L (ref 3.5–5.1)
Sodium: 139 mmol/L (ref 135–145)

## 2024-01-07 LAB — MAGNESIUM: Magnesium: 2.1 mg/dL (ref 1.7–2.4)

## 2024-01-07 LAB — PHOSPHORUS: Phosphorus: 3.8 mg/dL (ref 2.5–4.6)

## 2024-01-07 NOTE — Assessment & Plan Note (Addendum)
 KUB stable this AM. VSS, no signs of infection or perforation. Abdomen softer in exam. Continuing on aggressive bowel regimen as below. Mom unsure if she will be able to do enemas at home. Originally hopeful patient could go home this week, but doubtful now as his progress has been very slow. - Continue SMOG enemas TID - repeat KUB tomorrow AM - Dulcolax suppository daily - MiraLAX  3 times daily - Senna twice daily - Strict I/O

## 2024-01-07 NOTE — Plan of Care (Signed)

## 2024-01-07 NOTE — Assessment & Plan Note (Addendum)
 Autism/behavioral disturbances - continue home carbamazepine , paliperidone , amantadine , propranolol 

## 2024-01-07 NOTE — Progress Notes (Signed)
     Daily Progress Note Intern Pager: [phone]  Patient name: Brent Yang Medical record number: [id number] Date of birth: 06-Jul-2001 Age: 23 y.o. Gender: male  Primary Care Provider: Albin Huh, MD Consultants: GI (signed off) Code Status: FULL  Pt Overview and Major Events to Date:  4/17-admitted  Assessment and Plan: Brent Yang is a 23 year old male with PMH of autism spectrum disorder and moderate intellectual disability, admitted for chronic constipation complicated by abdominal distention and fecal compaction.  Continuing aggressive bowel regimen.  Assessment & Plan Fecal impaction in rectum (HCC) KUB stable this AM. VSS, no signs of infection or perforation. Abdomen softer in exam. Continuing on aggressive bowel regimen as below. Mom unsure if she will be able to do enemas at home. Originally hopeful patient could go home this week, but doubtful now as his progress has been very slow. - Continue SMOG enemas TID - repeat KUB tomorrow AM - Dulcolax suppository daily - MiraLAX  3 times daily - Senna twice daily - Strict I/O Chronic health problem Autism/behavioral disturbances - continue home carbamazepine , paliperidone , amantadine , propranolol   FEN/GI: Regular diet PPx: Lovenox  Dispo: Home pending clinical improvement/reduced stool burden.  Subjective:  Patient seen sleeping in bed this morning.  Awakens briefly for physical exam and then goes back to sleep.  No apparent distress  Objective: Temp:  [97.8 F (36.6 C)-98 F (36.7 C)] 98 F (36.7 C) (04/24 0737) Pulse Rate:  [72-98] 78 (04/24 0737) Resp:  [16-18] 18 (04/24 0737) BP: (82-100)/(55-88) 94/57 (04/24 0926) SpO2:  [98 %-100 %] 100 % (04/24 0737) Weight:  [92.3 kg] 92.3 kg (04/23 2210) Physical Exam: General: Lying in bed sleeping, Colace Respiratory: Normal work of breathing on room air Abdomen: Normoactive bowel sounds, soft, nondistended.  No apparent tenderness.  Laboratory: Most  recent CBC Lab Results  Component Value Date   WBC 4.7 01/03/2024   HGB 12.4 (L) 01/03/2024   HCT 36.5 (L) 01/03/2024   MCV 93.6 01/03/2024   PLT 251 01/03/2024   Most recent BMP    Latest Ref Rng & Units 01/07/2024    6:22 AM  BMP  Glucose 70 - 99 mg/dL 98   BUN 6 - 20 mg/dL 8   Creatinine 5.95 - 6.38 mg/dL 7.56   Sodium 433 - 295 mmol/L 139   Potassium 3.5 - 5.1 mmol/L 3.5   Chloride 98 - 111 mmol/L 103   CO2 22 - 32 mmol/L 28   Calcium 8.9 - 10.3 mg/dL 8.7     1/88 KUB: IMPRESSION: Stable fecal material within the right colon. Interval clearing in the sigmoid is noted.  Omar Bibber, DO 01/07/2024, 11:47 AM  PGY-1, [facility] FPTS Intern pager: [phone], text pages welcome Secure chat group [facility] Teaching Service

## 2024-01-07 NOTE — Assessment & Plan Note (Deleted)
 K and Mag WNL this AM. - Monitor BMP, Mg - Replete as appropriate

## 2024-01-07 NOTE — Plan of Care (Signed)

## 2024-01-08 ENCOUNTER — Inpatient Hospital Stay (HOSPITAL_COMMUNITY): Payer: MEDICAID

## 2024-01-08 DIAGNOSIS — K5641 Fecal impaction: Secondary | ICD-10-CM | POA: Diagnosis not present

## 2024-01-08 LAB — IRON AND TIBC
Iron: 109 ug/dL (ref 45–182)
Saturation Ratios: 51 % — ABNORMAL HIGH (ref 17.9–39.5)
TIBC: 216 ug/dL — ABNORMAL LOW (ref 250–450)
UIBC: 107 ug/dL

## 2024-01-08 LAB — FERRITIN: Ferritin: 94 ng/mL (ref 24–336)

## 2024-01-08 LAB — FOLATE: Folate: 5.5 ng/mL — ABNORMAL LOW (ref >5.9)

## 2024-01-08 LAB — VITAMIN B12: Vitamin B-12: 300 pg/mL (ref 180–914)

## 2024-01-08 MED ORDER — ONDANSETRON 4 MG PO TBDP
4.0000 mg | ORAL_TABLET | Freq: Three times a day (TID) | ORAL | Status: DC | PRN
  Administered 2024-01-09 – 2024-01-10 (×2): 4 mg via ORAL
  Filled 2024-01-08 (×2): qty 1

## 2024-01-08 MED ORDER — AMANTADINE HCL 100 MG PO CAPS
100.0000 mg | ORAL_CAPSULE | Freq: Every day | ORAL | Status: DC
  Administered 2024-01-09 – 2024-01-15 (×7): 100 mg via ORAL
  Filled 2024-01-08 (×7): qty 1

## 2024-01-08 MED ORDER — NA SULFATE-K SULFATE-MG SULF 17.5-3.13-1.6 GM/177ML PO SOLN
1.0000 | Freq: Once | ORAL | Status: AC
  Administered 2024-01-09: 354 mL via ORAL
  Filled 2024-01-08 (×2): qty 1

## 2024-01-08 MED ORDER — FOLIC ACID 1 MG PO TABS
1.0000 mg | ORAL_TABLET | Freq: Every day | ORAL | Status: DC
Start: 2024-01-08 — End: 2024-01-15
  Administered 2024-01-09 – 2024-01-15 (×7): 1 mg via ORAL
  Filled 2024-01-08 (×7): qty 1

## 2024-01-08 NOTE — Consult Note (Signed)
 Re-Consultation  Referring Provider:     Dr. Drue Gerald Primary Care Physician:  Albin Huh, MD Primary Gastroenterologist: Para Bold        Reason for Consultation:Obstipation              HPI:   Brent Yang is a 23 y.o. male with a past medical history as listed below including autism and intellectual delay, whom we initially consulted on 4/18 for fecal impaction.  Patient is still not improving and we are reconsulted today.    01/01/24 consult note discusses that patient was admitted the day before for fecal impaction with a CT showing massively distended colon with stool and gas, sigmoid colon distended 20 cm with stool, despite milk and molasses enema, Fleet enema and attempted manual disimpaction, it had not resolved.  KUB with persistent, extremely large volume stool and persistent rectal distention, given a smog enema as well as twice daily senna.-Noted patient was nonverbal.  At that time patient was having a bowel movement after smog enema.  1 is very soft and large.  At that time recommendations prior team refer soapsuds enema in the morning and twice daily senna as well as twice daily MiraLAX  and daily Dulcolax suppositories.  Repeat KUB.  Correction of magnesium  and potassium.    01/08/24 per resident's note patient has been receiving smog enemas 3 times daily, Dulcolax suppositories daily, MiraLAX  3 times daily and senna twice a day.  They are also tapering amantadine  however concerned this could be causing constipation.    Today, unfortunately the patient is found alone and really unable to answer any of my questions.  He just answers yes to anything I ask.  There are no nurses in the vicinity and I cannot even find the nurse assigned to this patient at time of my interview.  History is garnered from previous physicians notes as above.  Past Medical History:  Diagnosis Date   Allergic rhinitis    Autism    Intellectual delay    Peanut allergy     Past Surgical History:   Procedure Laterality Date   dental extractions     with anesthesia   DENTAL RESTORATION/EXTRACTION WITH X-RAY N/A 09/24/2023   Procedure: XRAY EXAM, FULL MOUTH DEBRIDEMENT, FILLING ON 2O, 14OBL, 15O, 19DOB; EXTRACTION OF TOOTH THREE;  Surgeon: Aida Alexander, DMD;  Location: [facility] OR;  Service: Dentistry;  Laterality: N/A;    History reviewed. No pertinent family history.   Social History   Tobacco Use   Smoking status: Never    Passive exposure: Never   Smokeless tobacco: Never  Vaping Use   Vaping status: Never Used  Substance Use Topics   Alcohol use: Never   Drug use: Never    Prior to Admission medications   Medication Sig Start Date End Date Taking? Authorizing Provider  amantadine  (SYMMETREL ) 100 MG capsule GIVE 1 CAPSULE BY MOUTH TWICE A DAY(8AM+4PM) FOR EPS PREVENTION Patient taking differently: Take 100 mg by mouth at bedtime. GIVE 1 CAPSULE BY MOUTH TWICE A DAY(8AM+4PM) FOR EPS PREVENTION 02/06/22  Yes Hagler, Polly Brink, MD  Carbamazepine  (EQUETRO ) 300 MG CP12 Take 1 capsule (300 mg total) by mouth 2 (two) times daily. Patient taking differently: Take 300 mg by mouth at bedtime. 02/06/22  Yes Hagler, Polly Brink, MD  EPINEPHRINE  0.3 mg/0.3 mL IJ SOAJ injection INJECT CONTENTS OF ONE DEVICE, 0.3 ML, INTO MUSCLE IN EVENT OF ANAPHYLAXIS 08/21/20  Yes Carlynn Chiles, MD  gabapentin  (NEURONTIN ) 400 MG capsule Take  400 mg by mouth at bedtime.   Yes [provider]  paliperidone  (INVEGA ) 9 MG 24 hr tablet TAKE 1 TABLET BY MOUTH DAILY AT 8AM FOR AGGRESSION/MOOD Patient taking differently: Take 9 mg by mouth at bedtime. 02/06/22  Yes Hagler, Polly Brink, MD  polyethylene glycol powder (GLYCOLAX /MIRALAX ) 17 GM/SCOOP powder Mix 1 capful in 8 oz liquid and drink once daily to promote soft stools; decrease dose to 1/2 capful if stools too loose Patient taking differently: Take 17 g by mouth 4 (four) times a week. Mix 1 capful in 8 oz liquid and drink  to promote soft stools; decrease dose to 1/2  capful if stools too loose 12/24/20  Yes Carlynn Chiles, MD  propranolol  (INDERAL ) 40 MG tablet Take 1 tablet (40 mg total) by mouth 2 (two) times daily. 02/06/22  Yes Afton Albright, MD    Current Facility-Administered Medications  Medication Dose Route Frequency Provider Last Rate Last Admin   [START ON 01/09/2024] amantadine  (SYMMETREL ) capsule 100 mg  100 mg Oral Daily Clem Currier, DO       bisacodyl  (DULCOLAX) suppository 10 mg  10 mg Rectal Daily Edison Gore, MD   10 mg at 01/08/24 1039   carbamazepine  (TEGRETOL  XR) 12 hr tablet 300 mg  300 mg Oral BID McDiarmid, Demetra Filter, MD   300 mg at 01/08/24 1037   enoxaparin  (LOVENOX ) injection 40 mg  40 mg Subcutaneous Daily Baloch, Mahnoor, MD   40 mg at 01/08/24 1038   gabapentin  (NEURONTIN ) capsule 400 mg  400 mg Oral QHS McDiarmid, Demetra Filter, MD   400 mg at 01/07/24 2218   ondansetron  (ZOFRAN ) injection 4 mg  4 mg Intravenous Q6H PRN McDiarmid, Demetra Filter, MD   4 mg at 12/31/23 1145   paliperidone  (INVEGA ) 24 hr tablet 9 mg  9 mg Oral Daily Omar Bibber, DO   9 mg at 01/08/24 1037   polyethylene glycol (MIRALAX  / GLYCOLAX ) packet 17 g  17 g Oral TID AC & HS Nandigam, Kavitha V, MD   17 g at 01/08/24 1214   propranolol  (INDERAL ) tablet 40 mg  40 mg Oral BID Omar Bibber, DO   40 mg at 01/08/24 1037   senna (SENOKOT) tablet 17.2 mg  2 tablet Oral BID McDiarmid, Demetra Filter, MD   17.2 mg at 01/08/24 1039   sorbitol , magnesium  hydroxide, mineral oil, glycerin (SMOG) enema  960 mL Rectal Q8H Omar Bibber, DO   960 mL at 01/08/24 1448    Allergies as of 12/30/2023 - Review Complete 12/30/2023  Allergen Reaction Noted   Peanuts [peanut oil] Other (See Comments) 02/23/2013     Review of Systems:    Unable to complete due to autism   Physical Exam:  Vital signs in last 24 hours: Temp:  [97.3 F (36.3 C)-98.8 F (37.1 C)] 98 F (36.7 C) (04/25 1447) Pulse Rate:  [69-88] 75 (04/25 1447) Resp:  [16-18] 16 (04/25 1447) BP: (85-124)/(49-84) 85/49  (04/25 1447) SpO2:  [99 %-100 %] 100 % (04/25 1447) Last BM Date : 01/08/24 General:   Pleasant autistic African-American male appears to be in NAD, Well developed, Well nourished, alert and cooperative Head:  Normocephalic and atraumatic. Eyes:   PEERL, EOMI. No icterus. Conjunctiva pink. Ears:  Normal auditory acuity. Neck:  Supple Throat: Oral cavity and pharynx without inflammation, swelling or lesion. Teeth in good condition. Lungs: Respirations even and unlabored. Lungs clear to auscultation bilaterally.   No wheezes, crackles, or rhonchi.  Heart: Normal S1, S2.  No MRG. Regular rate and rhythm. No peripheral edema, cyanosis or pallor.  Abdomen:  Soft, nondistended, nontender. No rebound or guarding. Normal bowel sounds. No appreciable masses or hepatomegaly. Rectal:  Not performed.  Msk:  Symmetrical without gross deformities. Peripheral pulses intact.  Extremities:  Without edema, no deformity or joint abnormality. Normal ROM, normal sensation. Neurologic:  Alert and  oriented x4;  grossly normal neurologically. CN II-XII intact.  Skin:   Dry and intact without significant lesions or rashes. Psychiatric: Demonstrates good judgement and reason without abnormal affect or behaviors.   LAB RESULTS:  BMET Recent Labs    01/06/24 0501 01/07/24 0622  NA 141 139  K 3.4* 3.5  CL 103 103  CO2 28 28  GLUCOSE 92 98  BUN <5* 8  CREATININE 1.12 1.19  CALCIUM 8.8* 8.7*   STUDIES: DG Abd 1 View Result Date: 01/08/2024 CLINICAL DATA:  Constipation EXAM: ABDOMEN - 1 VIEW COMPARISON:  Yesterday FINDINGS: Stool mainly in the ascending and transverse colon. Gas especially at the sigmoid which is elongated. No rectal impaction. No evidence of small-bowel obstruction. No concerning mass effect or calcification. The lung bases are clear. IMPRESSION: Similar degree of residual stool in the ascending and transverse colon. Elsewhere the colon is distended by gas. No rectal impaction or obstructive  findings. Electronically Signed   By: Ronnette Coke M.D.   On: 01/08/2024 09:56   DG Abd 1 View Result Date: 01/07/2024 CLINICAL DATA:  Constipation EXAM: ABDOMEN - 1 VIEW COMPARISON:  01/06/2024 FINDINGS: Scattered large and small bowel gas is noted. Persistent fecal material is noted within the right colon. There is been interval clearing in the sigmoid. Bony abnormality is noted. IMPRESSION: Stable fecal material within the right colon. Interval clearing in the sigmoid is noted. Electronically Signed   By: Violeta Grey M.D.   On: 01/07/2024 09:27    Impression / Plan:   Impression: 1.  Chronic constipation/severe fecal impaction resulting in significant distention of the sigmoid colon: Our service initially saw on 4/18 when patient had just had results from a smog enema, repeat x-ray today shows similar degree of residual stool in the ascending and transverse colon elsewhere in the colon is distended by gas, no rectal impaction, patient seems fairly comfortable on exam, no real distention 2.  Hypokalemia: Noted previously has been corrected 3.  Autism 4.  Anemia  Plan: 1.  Continue current bowel regimen. 2.  Suprep trial today 3.  Patient is young and likelihood of cancer causing symptoms or other obstruction less likely.  Endoscopic workup would likely be low yield.  Would rather not pursue this at this juncture. 4.  Will recheck iron studies today as well as B12 and folate  Thank you for your kind consultation, we will continue to follow.  Kathy Parker Lew Prout  01/08/2024, 4:08 PM

## 2024-01-08 NOTE — Progress Notes (Signed)
 Daily Progress Note Intern Pager: [phone]  Patient name: Brent Yang Medical record number: [id number] Date of birth: 07-29-2001 Age: 23 y.o. Gender: male  Primary Care Provider: Albin Huh, MD Consultants: GI (signed off) Code Status: Full  Pt Overview and Major Events to Date:  4/17-admitted  Assessment and Plan: Brent Yang is a 23 year old male with PMH of autism spectrum disorder and moderate intellectual disability, admitted for chronic constipation complicated by abdominal distention and fecal compaction.  Continuing aggressive bowel regimen. Will re-consult GI. Assessment & Plan Fecal impaction in rectum (HCC) KUB without any improvement this AM -no stool impacted in rectum, however I am concerned that this means that there is a very hard stool ball higher up which is not going to improve with current interventions.  GI has previously discussed this patient with us  and at the time did not feel that he needed endoscopic intervention; I am extremely concerned now not improve without further intervention.  Fortunately patient does remain VSS, no signs of infection or perforation. Continuing on aggressive bowel regimen as below.  Mom observed enema yesterday with RN and received teaching.  She plans to return today to perform enema with RN at bedside to assist. - Reconsult GI for consideration of endoscopic disimpaction versus possible Gastrografin enema, or any other recommendations they can offer - Continue SMOG enemas TID - repeat KUB tomorrow AM - Dulcolax suppository daily - MiraLAX  3 times daily - Senna twice daily - Strict I/O - AM BMP, Mag, Phos - taper amantadine  to 100 mg daily - concern this could be contributing to constipation Chronic health problem Autism/behavioral disturbances - continue home carbamazepine  300 mg BID, paliperidone  9 mg daily, amantadine  100 mg daily, propranolol  40 mg BID   FEN/GI: Regular diet PPx: Lovenox  Dispo: Home  pending clinical improvement/reduced stool burden, will need good home care plan in place including caregiver comfort with enemas and other necessary medications.  Subjective:  Patient seen this morning lying in bed.  He has half eaten tray of breakfast bedside.  Objective: Temp:  [97.3 F (36.3 C)-98.8 F (37.1 C)] 97.3 F (36.3 C) (04/25 0825) Pulse Rate:  [69-88] 69 (04/25 0848) Resp:  [16-18] 18 (04/25 0825) BP: (86-124)/(51-84) 87/51 (04/25 0848) SpO2:  [99 %-100 %] 100 % (04/25 0825) Physical Exam: General: Lying in bed, no apparent discomfort or distress. Respiratory: Normal work of breathing Abdomen: Soft, though patient does jump to initial palpation of abdomen.  Unclear if this was due to discomfort or if I surprised him.  Laboratory: Most recent CBC Lab Results  Component Value Date   WBC 4.7 01/03/2024   HGB 12.4 (L) 01/03/2024   HCT 36.5 (L) 01/03/2024   MCV 93.6 01/03/2024   PLT 251 01/03/2024   Most recent BMP    Latest Ref Rng & Units 01/07/2024    6:22 AM  BMP  Glucose 70 - 99 mg/dL 98   BUN 6 - 20 mg/dL 8   Creatinine 1.47 - 8.29 mg/dL 5.62   Sodium 130 - 865 mmol/L 139   Potassium 3.5 - 5.1 mmol/L 3.5   Chloride 98 - 111 mmol/L 103   CO2 22 - 32 mmol/L 28   Calcium 8.9 - 10.3 mg/dL 8.7    7/84 KUB: IMPRESSION: Similar degree of residual stool in the ascending and transverse colon. Elsewhere the colon is distended by gas. No rectal impaction or obstructive findings.  Omar Bibber, DO 01/08/2024, 11:11 AM  PGY-1, [place] [facility] FPTS Intern pager: [phone], text pages welcome Secure chat group [facility] Teaching Service

## 2024-01-08 NOTE — Assessment & Plan Note (Addendum)
 Autism/behavioral disturbances - continue home carbamazepine  300 mg BID, paliperidone  9 mg daily, amantadine  100 mg daily, propranolol  40 mg BID

## 2024-01-08 NOTE — Assessment & Plan Note (Addendum)
 KUB without any improvement this AM -no stool impacted in rectum, however I am concerned that this means that there is a very hard stool ball higher up which is not going to improve with current interventions.  GI has previously discussed this patient with us  and at the time did not feel that he needed endoscopic intervention; I am extremely concerned now not improve without further intervention.  Fortunately patient does remain VSS, no signs of infection or perforation. Continuing on aggressive bowel regimen as below.  Mom observed enema yesterday with RN and received teaching.  She plans to return today to perform enema with RN at bedside to assist. - Reconsult GI for consideration of endoscopic disimpaction versus possible Gastrografin enema, or any other recommendations they can offer - Continue SMOG enemas TID - repeat KUB tomorrow AM - Dulcolax suppository daily - MiraLAX  3 times daily - Senna twice daily - Strict I/O - AM BMP, Mag, Phos - taper amantadine  to 100 mg daily - concern this could be contributing to constipation

## 2024-01-08 NOTE — Plan of Care (Signed)

## 2024-01-09 ENCOUNTER — Inpatient Hospital Stay (HOSPITAL_COMMUNITY): Payer: MEDICAID

## 2024-01-09 DIAGNOSIS — K5900 Constipation, unspecified: Secondary | ICD-10-CM

## 2024-01-09 LAB — BASIC METABOLIC PANEL WITH GFR
Anion gap: 10 (ref 5–15)
BUN: 5 mg/dL — ABNORMAL LOW (ref 6–20)
CO2: 24 mmol/L (ref 22–32)
Calcium: 9.6 mg/dL (ref 8.9–10.3)
Chloride: 105 mmol/L (ref 98–111)
Creatinine, Ser: 1.02 mg/dL (ref 0.61–1.24)
GFR, Estimated: >60 mL/min (ref >60)
Glucose, Bld: 128 mg/dL — ABNORMAL HIGH (ref 70–99)
Potassium: 4 mmol/L (ref 3.5–5.1)
Sodium: 139 mmol/L (ref 135–145)

## 2024-01-09 LAB — PHOSPHORUS: Phosphorus: 3.2 mg/dL (ref 2.5–4.6)

## 2024-01-09 LAB — MAGNESIUM: Magnesium: 2.1 mg/dL (ref 1.7–2.4)

## 2024-01-09 LAB — TSH: TSH: 0.704 u[IU]/mL (ref 0.350–4.500)

## 2024-01-09 MED ORDER — POLYETHYLENE GLYCOL 3350 17 G PO PACK
17.0000 g | PACK | Freq: Three times a day (TID) | ORAL | Status: DC
  Administered 2024-01-09 – 2024-01-13 (×16): 17 g via ORAL
  Filled 2024-01-09 (×15): qty 1

## 2024-01-09 MED ORDER — POLYETHYLENE GLYCOL 3350 17 G PO PACK
34.0000 g | PACK | Freq: Three times a day (TID) | ORAL | Status: DC
Start: 2024-01-09 — End: 2024-01-09

## 2024-01-09 MED ORDER — ONDANSETRON HCL 4 MG/2ML IJ SOLN: 4.0000 mg | Freq: Once | INTRAMUSCULAR | Status: DC

## 2024-01-09 NOTE — Assessment & Plan Note (Signed)
 Autism/behavioral disturbances - continue home carbamazepine  300 mg BID, paliperidone  9 mg daily, amantadine  100 mg daily, propranolol  40 mg BID

## 2024-01-09 NOTE — Plan of Care (Signed)

## 2024-01-09 NOTE — Assessment & Plan Note (Signed)
 Abm remains distended. Progress has been slow over past few days, will continue bowel regimen as below.  - Dulcolax suppository daily - Miralax  4 times daily - Senna BID - SMOG enema TID - Consider reattempting Suprep regimen today if tolerated - GI consulted, appreciate recs - Strict I/O - AM BMP

## 2024-01-09 NOTE — Progress Notes (Signed)
 Subjective: Vomited last evening before starting on Suprep.  Objective: Vital signs in last 24 hours: Temp:  [98 F (36.7 C)-98.3 F (36.8 C)] 98 F (36.7 C) (04/26 0900) Pulse Rate:  [68-85] 85 (04/26 0900) Resp:  [16-18] 18 (04/26 0900) BP: (85-127)/(49-98) 121/86 (04/26 0900) SpO2:  [99 %-100 %] 99 % (04/26 0900) Last BM Date : 01/08/24  Intake/Output from previous day: No intake/output data recorded. Intake/Output this shift: No intake/output data recorded.  General appearance: alert and no distress GI: soft, non-tender; bowel sounds normal; no masses,  no organomegaly  Lab Results: No results for input(s): "WBC", "HGB", "HCT", "PLT" in the last 72 hours. BMET Recent Labs    01/07/24 0622  NA 139  K 3.5  CL 103  CO2 28  GLUCOSE 98  BUN 8  CREATININE 1.19  CALCIUM 8.7*   LFT No results for input(s): "PROT", "ALBUMIN", "AST", "ALT", "ALKPHOS", "BILITOT", "BILIDIR", "IBILI" in the last 72 hours. PT/INR No results for input(s): "LABPROT", "INR" in the last 72 hours. Hepatitis Panel No results for input(s): "HEPBSAG", "HCVAB", "HEPAIGM", "HEPBIGM" in the last 72 hours. C-Diff No results for input(s): "CDIFFTOX" in the last 72 hours. Fecal Lactopherrin No results for input(s): "FECLLACTOFRN" in the last 72 hours.  Studies/Results: DG Abd 1 View Result Date: 01/08/2024 CLINICAL DATA:  Constipation EXAM: ABDOMEN - 1 VIEW COMPARISON:  Yesterday FINDINGS: Stool mainly in the ascending and transverse colon. Gas especially at the sigmoid which is elongated. No rectal impaction. No evidence of small-bowel obstruction. No concerning mass effect or calcification. The lung bases are clear. IMPRESSION: Similar degree of residual stool in the ascending and transverse colon. Elsewhere the colon is distended by gas. No rectal impaction or obstructive findings. Electronically Signed   By: Ronnette Coke M.D.   On: 01/08/2024 09:56    Medications: Scheduled:  amantadine   100 mg  Oral Daily   bisacodyl   10 mg Rectal Daily   carbamazepine   300 mg Oral BID   enoxaparin  (LOVENOX ) injection  40 mg Subcutaneous Daily   folic acid  1 mg Oral Daily   gabapentin   400 mg Oral QHS   Na Sulfate-K Sulfate-Mg Sulfate concentrate  1 kit Oral Once   paliperidone   9 mg Oral Daily   polyethylene glycol  17 g Oral TID AC & HS   propranolol   40 mg Oral BID   senna  2 tablet Oral BID   SMOG  960 mL Rectal Q8H   Continuous:  Assessment/Plan: 1) Constipation. 2) Autism.   The reason for his vomiting last evening was not known.  He was not able to try using Suprep.  He had some liquid bowel movements this AM, but this seems to be from the enema.    Plan: 1) Reattempt Suprep this AM. 2) Continue with enemas.  LOS: 9 days   Dajon Lazar D 01/09/2024, 9:43 AM

## 2024-01-09 NOTE — Plan of Care (Signed)
 Called mom to update on hospital progression.  Explained that he is tolerating oral medicine and he has had 2 bowel movements today.  He will continue to need cleanout before he is appropriate for home.  Mom will also need to demonstrate that she can properly administer enemas and medication.  Clem Currier, DO [facility], PGY-2 01/09/24 3:30 PM

## 2024-01-09 NOTE — Progress Notes (Signed)
     Daily Progress Note Intern Pager: [phone]  Patient name: Brent Yang Medical record number: [id number] Date of birth: March 02, 2001 Age: 23 y.o. Gender: male  Primary Care Provider: Albin Huh, MD Consultants: GI Code Status: Full  Pt Overview and Major Events to Date:  4/17-admitted   Assessment and Plan: KM is a 23yo M w/ hx of ASD, intellectual delay that is admitted for acute fecal impaction on chronic constipation.  Assessment & Plan Constipation Abm remains distended. Progress has been slow over past few days, will continue bowel regimen as below.  - Dulcolax suppository daily - Miralax  4 times daily - Senna BID - SMOG enema TID - Consider reattempting Suprep regimen today if tolerated - GI consulted, appreciate recs - Strict I/O - AM BMP Chronic health problem Autism/behavioral disturbances - continue home carbamazepine  300 mg BID, paliperidone  9 mg daily, amantadine  100 mg daily, propranolol  40 mg BID   FEN/GI: Regular PPx: Lovenox  Dispo:Home pending clinical improvement/reduced stool burden, will need good home care plan in place including caregiver comfort with enemas and other necessary medications.   Subjective:  Was unable to tolerate Suprep last night due to nausea. Pt was given zofran  ODT. 2BM charted for yesterday.  This AM, pt sleeping comfortably. Easily awoke to voice.  Objective: Temp:  [97.3 F (36.3 C)-98.1 F (36.7 C)] 98.1 F (36.7 C) (04/25 2054) Pulse Rate:  [69-79] 75 (04/25 2054) Resp:  [16-18] 18 (04/25 2054) BP: (85-127)/(49-98) 127/98 (04/25 2054) SpO2:  [100 %] 100 % (04/25 2054) Physical Exam: General: Alert, laying in bed, NAD.  Cardiovascular: RRR Respiratory: CTAB. Normal WOB on RA Abdomen: Diffusely distended. Normal BS. Appears diffusely mildly uncomfortable to palpation.  Laboratory: Most recent CBC Lab Results  Component Value Date   WBC 4.7 01/03/2024   HGB 12.4 (L) 01/03/2024   HCT 36.5 (L) 01/03/2024    MCV 93.6 01/03/2024   PLT 251 01/03/2024   Most recent BMP    Latest Ref Rng & Units 01/07/2024    6:22 AM  BMP  Glucose 70 - 99 mg/dL 98   BUN 6 - 20 mg/dL 8   Creatinine 1.01 - 7.51 mg/dL 0.25   Sodium 852 - 778 mmol/L 139   Potassium 3.5 - 5.1 mmol/L 3.5   Chloride 98 - 111 mmol/L 103   CO2 22 - 32 mmol/L 28   Calcium 8.9 - 10.3 mg/dL 8.7      Albin Huh, MD 01/09/2024, 4:48 AM  PGY-2, [place] [facility] FPTS Intern pager: [phone], text pages welcome Secure chat group [facility] Teaching Service

## 2024-01-10 ENCOUNTER — Inpatient Hospital Stay (HOSPITAL_COMMUNITY): Payer: MEDICAID

## 2024-01-10 DIAGNOSIS — K5900 Constipation, unspecified: Secondary | ICD-10-CM | POA: Diagnosis not present

## 2024-01-10 LAB — BASIC METABOLIC PANEL WITH GFR
Anion gap: 9 (ref 5–15)
BUN: 7 mg/dL (ref 6–20)
CO2: 27 mmol/L (ref 22–32)
Calcium: 9.5 mg/dL (ref 8.9–10.3)
Chloride: 104 mmol/L (ref 98–111)
Creatinine, Ser: 1.16 mg/dL (ref 0.61–1.24)
GFR, Estimated: >60 mL/min (ref >60)
Glucose, Bld: 122 mg/dL — ABNORMAL HIGH (ref 70–99)
Potassium: 3.9 mmol/L (ref 3.5–5.1)
Sodium: 140 mmol/L (ref 135–145)

## 2024-01-10 NOTE — Care Plan (Signed)
 Spoke with Dr. Nickey Barn, GI; greatly appreciate his thoughts and recommendations as follow.  Patient may benefit from oral purge, however he has had vomiting x 3 days and he may not be able to tolerate this.  Could consider NG tube delivery if the patient would tolerate that.  We do share concern that too much via PO could cause additional problems if patient does have blockage.  Likely that surgery may need to be consulted for consideration of colectomy/colostomy.  Bowel distention may be so severe at this point there is a chance bowel function may not recover.  Will repeat CT scan for better indication of changes in stool burden and/or blockage.  Again, greatly appreciate Dr. Cleora Daft assistance with this difficult situation.  Omar Bibber, DO [facility], PGY-1 01/10/24 1:29 PM  Service pager [phone]

## 2024-01-10 NOTE — Assessment & Plan Note (Addendum)
 Autism/behavioral disturbances - continue home carbamazepine  300 mg BID, paliperidone  9 mg daily, amantadine  100 mg daily, propranolol  40 mg BID

## 2024-01-10 NOTE — Plan of Care (Signed)

## 2024-01-10 NOTE — Care Plan (Signed)
 Spoke to APS caseworker Gordon Latus and filed report. Report being sent to supervisor to be screened in/out. APS will reach out to FMTS for further information as needed.  Omar Bibber, DO [facility], PGY-1 01/10/24 4:33 PM

## 2024-01-10 NOTE — Progress Notes (Signed)
 Subjective: No acute events.  Objective: Vital signs in last 24 hours: Temp:  [97.8 F (36.6 C)-98.1 F (36.7 C)] 98.1 F (36.7 C) (04/27 0717) Pulse Rate:  [60-85] 85 (04/27 0717) Resp:  [17-18] 18 (04/27 0717) BP: (107-126)/(76-90) 126/90 (04/27 0717) SpO2:  [99 %-100 %] 100 % (04/27 0717) Last BM Date : 01/10/24  Intake/Output from previous day: 04/26 0701 - 04/27 0700 In: 240 [P.O.:240] Out: -  Intake/Output this shift: No intake/output data recorded.  General appearance: alert and no distress GI: soft, non-tender; bowel sounds normal; no masses,  no organomegaly  Lab Results: No results for input(s): "WBC", "HGB", "HCT", "PLT" in the last 72 hours. BMET Recent Labs    01/09/24 1129 01/10/24 0415  NA 139 140  K 4.0 3.9  CL 105 104  CO2 24 27  GLUCOSE 128* 122*  BUN 5* 7  CREATININE 1.02 1.16  CALCIUM 9.6 9.5   LFT No results for input(s): "PROT", "ALBUMIN", "AST", "ALT", "ALKPHOS", "BILITOT", "BILIDIR", "IBILI" in the last 72 hours. PT/INR No results for input(s): "LABPROT", "INR" in the last 72 hours. Hepatitis Panel No results for input(s): "HEPBSAG", "HCVAB", "HEPAIGM", "HEPBIGM" in the last 72 hours. C-Diff No results for input(s): "CDIFFTOX" in the last 72 hours. Fecal Lactopherrin No results for input(s): "FECLLACTOFRN" in the last 72 hours.  Studies/Results: DG Abd 1 View Result Date: 01/10/2024 CLINICAL DATA:  Constipation EXAM: ABDOMEN - 1 VIEW COMPARISON:  01/09/2024 FINDINGS: Persistent gaseous distension of the colon. The degree of fecal material within the colon has improved when compared with the prior exam. No bony abnormality is noted. IMPRESSION: Persistent gaseous distension of the stomach although the fecal burden has decreased. Electronically Signed   By: Violeta Grey M.D.   On: 01/10/2024 10:40   DG Abd 1 View Result Date: 01/09/2024 CLINICAL DATA:  Constipation EXAM: ABDOMEN - 1 VIEW COMPARISON:  Yesterday FINDINGS: Progressive  distension of colon, especially from increased gas, transverse segment up to 8 cm in diameter. Superimposed stool is seen to a similar degree especially at the ascending and transverse segments. No rectal impaction. No concerning small bowel dilatation or intra-abdominal mass effect. Clear lung bases. IMPRESSION: Progressive distension of colon primarily from increasing gas, degree of stool is unchanged. Electronically Signed   By: Ronnette Coke M.D.   On: 01/09/2024 10:20    Medications: Scheduled:  amantadine   100 mg Oral Daily   bisacodyl   10 mg Rectal Daily   carbamazepine   300 mg Oral BID   enoxaparin  (LOVENOX ) injection  40 mg Subcutaneous Daily   folic acid  1 mg Oral Daily   gabapentin   400 mg Oral QHS   paliperidone   9 mg Oral Daily   polyethylene glycol  17 g Oral TID AC & HS   propranolol   40 mg Oral BID   senna  2 tablet Oral BID   SMOG  960 mL Rectal Q8H   Continuous:  Assessment/Plan: 1) Chronic constipation. 2) ? Fecal impaction. 3) Autism.   I discussed the situation with Dr. Bernardino Bridge.  Reviewing his CT scan it was rather impressive with the massive dilation of the sigmoid colon.  At this point I am not certain if he is committed to a colectomy or colostomy.  There is the possibility of Hirschsprung's disease, but at this point it is a moot issue.  He is autistic and per Dr. Bernardino Bridge, her mother was not diligent with providing him with his daily laxatives.  The rectal examination today was  negative for any fecal impaction in the rectal vault.  On the admission CT scan there was the possibility of a large stool ball in the sigmoid colon.  It will be best to repeat the CT scan and assess the effect of the enemas.  With his vomiting, which may be from the constipation, there is a risk for him to aspiration with trying any oral purge.  Plan: 1) Repeat CT scan of the abdomen. 2) Continue with enemas for now. 3) If there was no improvement with the enemas, a Surgical consultation  will be beneficial.  It is not known if the patient has any retained peristalsis with his colon. 4) [facility] will resume care in the AM.  LOS: 10 days   Dorethia Jeanmarie D 01/10/2024, 1:25 PM

## 2024-01-10 NOTE — Progress Notes (Addendum)
     Daily Progress Note Intern Pager: [phone]  Patient name: Brent Yang Medical record number: [id number] Date of birth: April 07, 2001 Age: 23 y.o. Gender: male  Primary Care Provider: Albin Huh, MD Consultants: GI Code Status: Full  Pt Overview and Major Events to Date:  4/17-admitted  Assessment and Plan: Brent Yang is a 23 year old male with history of ASD, intellectual delay.  Admitted for severe fecal impaction and constipation receiving aggressive regimen.  Continuing to have emesis this morning. Assessment & Plan Constipation Reattempted Suprep yesterday after the patient had vomiting 4/25 - unclear if Brent Yang was able to tolerate any of this due to vomiting. Vomitus NBNB. 4 BM documented since yesterday - liquid consistency, likely from enema; suspect this represents leaking around hardened stool ball rather than stool ball actually breaking down. Progress remains very slow - AM KUB with persistent gaseous distention, decreased fecal burden. On my read fecal burden does not appear much changed from prior. - Dulcolax suppository daily - Miralax  4 times daily - Senna BID - SMOG enema TID - GI following, appreciate recs  - Strict I/O - AM BMP, KUB Chronic health problem Autism/behavioral disturbances - continue home carbamazepine  300 mg BID, paliperidone  9 mg daily, amantadine  100 mg daily, propranolol  40 mg BID   FEN/GI: Regular diet PPx: Lovenox  Dispo:Home pending clinical improvement . Barriers include caregiver comfort with enemas and other necessary medications.  Will need good home care plan in place prior to discharge.   Subjective:  Patient seen this morning lying in bed sleeping, has full tray of breakfast at bedside.  He allows abdominal exam without guarding or other apparent discomfort.  Made aware by RN this morning that patient is continuing to vomit, NBNB.  Objective: Temp:  [97.8 F (36.6 C)-98.1 F (36.7 C)] 98.1 F (36.7 C) (04/27  0717) Pulse Rate:  [60-85] 85 (04/27 0717) Resp:  [17-18] 18 (04/27 0717) BP: (107-126)/(76-90) 126/90 (04/27 0717) SpO2:  [99 %-100 %] 100 % (04/27 0717) Physical Exam: General: Lying in bed, NAD Respiratory: Normal work of breathing on room air Abdomen: Soft, nondistended, no apparent tenderness to palpation  Laboratory: Most recent CBC Lab Results  Component Value Date   WBC 4.7 01/03/2024   HGB 12.4 (L) 01/03/2024   HCT 36.5 (L) 01/03/2024   MCV 93.6 01/03/2024   PLT 251 01/03/2024   Most recent BMP    Latest Ref Rng & Units 01/10/2024    4:15 AM  BMP  Glucose 70 - 99 mg/dL 130   BUN 6 - 20 mg/dL 7   Creatinine 8.65 - 7.84 mg/dL 6.96   Sodium 295 - 284 mmol/L 140   Potassium 3.5 - 5.1 mmol/L 3.9   Chloride 98 - 111 mmol/L 104   CO2 22 - 32 mmol/L 27   Calcium 8.9 - 10.3 mg/dL 9.5    1/32 KUB: IMPRESSION: Persistent gaseous distension of the stomach although the fecal burden has decreased.  I have reviewed the imaging above; I agree with the initial portion of the read regarding persistent gaseous distention, however fecal burden does appear to me unchanged from prior.  Omar Bibber, DO 01/10/2024, 11:30 AM  PGY-1, [facility] FPTS Intern pager: [phone], text pages welcome Secure chat group [facility] Teaching Service

## 2024-01-10 NOTE — Assessment & Plan Note (Addendum)
 Reattempted Suprep yesterday after the patient had vomiting 4/25 - unclear if Rachael was able to tolerate any of this due to vomiting. Vomitus NBNB. 4 BM documented since yesterday - liquid consistency, likely from enema; suspect this represents leaking around hardened stool ball rather than stool ball actually breaking down. Progress remains very slow - AM KUB with persistent gaseous distention, decreased fecal burden. On my read fecal burden does not appear much changed from prior. - Dulcolax suppository daily - Miralax  4 times daily - Senna BID - SMOG enema TID - GI following, appreciate recs  - Strict I/O - AM BMP, KUB

## 2024-01-11 DIAGNOSIS — K5900 Constipation, unspecified: Secondary | ICD-10-CM | POA: Diagnosis not present

## 2024-01-11 LAB — BASIC METABOLIC PANEL WITH GFR
Anion gap: 8 (ref 5–15)
BUN: 8 mg/dL (ref 6–20)
CO2: 31 mmol/L (ref 22–32)
Calcium: 9.7 mg/dL (ref 8.9–10.3)
Chloride: 100 mmol/L (ref 98–111)
Creatinine, Ser: 1.29 mg/dL — ABNORMAL HIGH (ref 0.61–1.24)
GFR, Estimated: >60 mL/min (ref >60)
Glucose, Bld: 118 mg/dL — ABNORMAL HIGH (ref 70–99)
Potassium: 3.9 mmol/L (ref 3.5–5.1)
Sodium: 139 mmol/L (ref 135–145)

## 2024-01-11 NOTE — Progress Notes (Signed)
 Daily Progress Note  DOA: 12/30/2023 Hospital Day: 13   Chief Complaint:  colonic ileus  ASSESSMENT    23 y.o. year old male with intellectual delay, autism, chronic constipation. 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 No getting narcotics.  Appears to have vomited in bed this am.   PLAN   Recommend Surgical evaluation for consideration of a colostomy.   Subjective   Non-verbal. A lot of clear liquid with food debris in bed ( ? Emesis).    Objective   GI Studies:   Recent Labs    01/08/24 1847  FOLATE 5.5*  VITAMINB12 300  FERRITIN 94  TIBC 216*  IRONPCTSAT 51*   Recent Labs    01/09/24 1129 01/10/24 0415 01/11/24 0555  NA 139 140 139  K 4.0 3.9 3.9  CL 105 104 100  CO2 24 27 31   GLUCOSE 128* 122* 118*  BUN 5* 7 8  CREATININE 1.02 1.16 1.29*  CALCIUM 9.6 9.5 9.7  Imaging:  CT ABDOMEN PELVIS WO CONTRAST CLINICAL DATA:  Bowel obstruction  EXAM: CT ABDOMEN AND PELVIS WITHOUT CONTRAST  TECHNIQUE: Multidetector CT imaging of the abdomen and pelvis was performed following the standard protocol without IV contrast.  RADIATION DOSE REDUCTION: This exam was performed according to the departmental dose-optimization program which includes automated exposure control, adjustment of the mA and/or kV according to patient size and/or use of iterative reconstruction technique.  COMPARISON:  None Available.  FINDINGS: Lower chest: No acute abnormality.  Hepatobiliary: No focal liver abnormality is seen. No gallstones, gallbladder wall thickening, or biliary dilatation.  Pancreas: Unremarkable. No pancreatic ductal dilatation  or surrounding inflammatory changes.  Spleen: Normal in size without focal abnormality.  Adrenals/Urinary Tract: Adrenal glands are unremarkable. Kidneys are normal, without renal calculi, focal lesion, or hydronephrosis. Bladder is unremarkable.  Stomach/Bowel: The rectosigmoid colon is redundant. Previously noted large volume stool has been largely evacuated. The colon and rectum remain gas and fluid-filled, however, suggesting changes of an underlying colonic ileus. There is, additionally, superimposed circumferential wall thickening of the rectosigmoid colon suggesting changes of an underlying mild proctocolitis. No evidence of obstruction. No free intraperitoneal gas or fluid. The stomach, small bowel and large are otherwise unremarkable. Appendix normal.  Vascular/Lymphatic: No significant vascular findings are present. No enlarged abdominal or pelvic lymph nodes.  Reproductive: Prostate is unremarkable.  Other: No abdominal wall hernia or abnormality. No abdominopelvic ascites.  Musculoskeletal: No acute or significant osseous findings.  IMPRESSION: 1. Previously noted large volume stool has been largely evacuated. The colon and rectum remain gas and fluid-filled, however, suggesting changes of an underlying colonic ileus. There is, additionally, superimposed circumferential wall thickening of the rectosigmoid colon suggesting changes of an underlying mild proctocolitis. No evidence of obstruction or perforation.  Electronically Signed   By: Worthy Heads M.D.   On: 01/10/2024 21:53 DG Abd 1 View CLINICAL DATA:  Constipation  EXAM: ABDOMEN - 1 VIEW  COMPARISON:  01/09/2024  FINDINGS: Persistent gaseous distension of the colon. The degree of fecal material within the colon has improved when compared with the prior exam. No bony abnormality is noted.  IMPRESSION: Persistent gaseous distension of the stomach although the fecal burden has  decreased.  Electronically Signed   By: Violeta Grey M.D.   On: 01/10/2024 10:40     Scheduled inpatient medications:   amantadine   100 mg Oral Daily   bisacodyl   10 mg Rectal Daily   carbamazepine   300 mg Oral BID   enoxaparin  (LOVENOX ) injection  40 mg Subcutaneous Daily   folic acid  1 mg Oral Daily   gabapentin   400 mg Oral QHS   paliperidone   9 mg Oral Daily   polyethylene glycol  17 g Oral TID AC & HS   propranolol   40 mg Oral BID   senna  2 tablet Oral BID   SMOG  960 mL Rectal Q8H   Continuous inpatient infusions:  PRN inpatient medications: ondansetron   Vital signs in last 24 hours: Temp:  [97.8 F (36.6 C)-98.8 F (37.1 C)] 98.7 F (37.1 C) (04/28 0758) Pulse Rate:  [64-98] 65 (04/28 0758) Resp:  [16-18] 18 (04/28 0758) BP: (99-150)/(62-95) 128/95 (04/28 0758) SpO2:  [98 %-100 %] 100 % (04/28 0758) Last BM Date : 01/10/24 No intake or output data in the 24 hours ending 01/11/24 1001  Intake/Output from previous day: No intake/output data recorded. Intake/Output this shift: No intake/output data recorded.   Physical Exam:  General: Alert male in NAD Heart:  Regular rate and rhythm.  Pulmonary: Normal respiratory effort Abdomen: Soft, mildly distended, nontender. Normal bowel sounds. Neurologic: Alert and oriented Psych: Pleasant. Cooperative    LOS: 11 days   Mai Schwalbe ,NP 01/11/2024, 10:01 AM

## 2024-01-11 NOTE — TOC Progression Note (Signed)
 Transition of Care [facility]) - Progression Note    Patient Details  Name: Brent Yang MRN: [id number] Date of Birth: July 30, 2001  Transition of Care [facility]) CM/SW Contact  Jeani Mill, RN Phone Number: 01/11/2024, 9:54 AM  Clinical Narrative:    Per chart review from GI note,  If there was no improvement with the enemas, a Surgical consultation will be beneficial.  TOC following.   Expected Discharge Plan: Home/Self Care Barriers to Discharge: Continued Medical Work up  Expected Discharge Plan and Services       Living arrangements for the past 2 months: Single Family Home                                       Social Determinants of Health (SDOH) Interventions SDOH Screenings   Food Insecurity: Patient Unable To Answer (12/31/2023)  Housing: Patient Unable To Answer (12/31/2023)  Transportation Needs: Patient Unable To Answer (12/31/2023)  Utilities: Patient Unable To Answer (12/31/2023)  Depression (PHQ2-9): Medium Risk (03/09/2023)  Tobacco Use: Low Risk  (12/30/2023)    Readmission Risk Interventions     No data to display

## 2024-01-11 NOTE — TOC Progression Note (Signed)
 Transition of Care [facility]) - Progression Note    Patient Details  Name: Brent Yang MRN: [id number] Date of Birth: 2001-03-13  Transition of [facility]) CM/SW Contact  Juliane Och, LCSW Phone Number: 01/11/2024, 12:10 PM  Clinical Narrative:     12:11 PM APS social worker, Richardo Chandler [phone]) established contact with CSW.  Expected Discharge Plan: Home/Self Care Barriers to Discharge: Continued Medical Work up  Expected Discharge Plan and Services       Living arrangements for the past 2 months: Single Family Home                                       Social Determinants of Health (SDOH) Interventions SDOH Screenings   Food Insecurity: Patient Unable To Answer (12/31/2023)  Housing: Patient Unable To Answer (12/31/2023)  Transportation Needs: Patient Unable To Answer (12/31/2023)  Utilities: Patient Unable To Answer (12/31/2023)  Depression (PHQ2-9): Medium Risk (03/09/2023)  Tobacco Use: Low Risk  (12/30/2023)    Readmission Risk Interventions     No data to display

## 2024-01-11 NOTE — Progress Notes (Addendum)
     Daily Progress Note Intern Pager: [phone]  Patient name: Brent Yang Medical record number: [id number] Date of birth: 03-Feb-2001 Age: 23 y.o. Gender: male  Primary Care Provider: Albin Huh, MD Consultants: GI Code Status: FULL  Pt Overview and Major Events to Date:  4/16 - admitted; CT showed massively distended colon with stool and gas 4/27 - repeat CT with improvement in stool burden, chronic ileus  Assessment and Plan: Brent Yang is  23 year old male with history of ASD, intellectual delay.  Admitted for severe fecal impaction and massively distended colon, receiving aggressive bowel regimen.  CT yesterday with improvement in stool burden. Assessment & Plan Constipation Repeat CT yesterday with improvement in stool burden, however patient still has significantly distended bowel.  Concern that colon has been distended for so long now that this will not resolve on its own.  Possibly still surgical indication.  Will follow with GI recs. - Dulcolax suppository daily - Miralax  4 times daily - Senna BID - SMOG enema TID - GI following, appreciate recs  - Strict I/O - AM BMP Chronic health problem Autism/behavioral disturbances - continue home carbamazepine  300 mg BID, paliperidone  9 mg daily, amantadine  100 mg daily, propranolol  40 mg BID   FEN/GI: Regular diet PPx: Lovenox  Dispo:Home pending clinical improvement . Barriers include further workup, bowel regimen management, caregiver comfort with enemas and other necessary medications prior to discharge.   Subjective:  Patient seen this morning ambulating around room, he has eaten some of his breakfast tray. Does not appear uncomfortable.  Objective: Temp:  [97.8 F (36.6 C)-98.8 F (37.1 C)] 98.7 F (37.1 C) (04/28 0758) Pulse Rate:  [64-98] 65 (04/28 0758) Resp:  [16-18] 18 (04/28 0758) BP: (99-150)/(62-95) 128/95 (04/28 0758) SpO2:  [98 %-100 %] 100 % (04/28 0758) Physical Exam: General: Ambulating  around room without pain or difficulty. Respiratory: Normal work of breathing on room air. Abdomen: abdomen no more distended than usual on visual exam. Extremities: Moves all extremities equally.  Laboratory: Most recent CBC Lab Results  Component Value Date   WBC 4.7 01/03/2024   HGB 12.4 (L) 01/03/2024   HCT 36.5 (L) 01/03/2024   MCV 93.6 01/03/2024   PLT 251 01/03/2024   Most recent BMP    Latest Ref Rng & Units 01/11/2024    5:55 AM  BMP  Glucose 70 - 99 mg/dL 147   BUN 6 - 20 mg/dL 8   Creatinine 8.29 - 5.62 mg/dL 1.30   Sodium 865 - 784 mmol/L 139   Potassium 3.5 - 5.1 mmol/L 3.9   Chloride 98 - 111 mmol/L 100   CO2 22 - 32 mmol/L 31   Calcium 8.9 - 10.3 mg/dL 9.7    6/96 CT A/P: IMPRESSION: 1. Previously noted large volume stool has been largely evacuated. The colon and rectum remain gas and fluid-filled, however, suggesting changes of an underlying colonic ileus. There is, additionally, superimposed circumferential wall thickening of the rectosigmoid colon suggesting changes of an underlying mild proctocolitis. No evidence of obstruction or perforation.  I have independently reviewed the imaging above and agree with the read per the radiologist.  Omar Bibber, DO 01/11/2024, 8:59 AM  PGY-1, [facility] FPTS Intern pager: [phone], text pages welcome Secure chat group [facility] Teaching Service

## 2024-01-11 NOTE — Plan of Care (Signed)
 Called patient's mother and provided update regarding CT abdomen pelvis yesterday.  Explained that gastroenterology team recommend surgical evaluation.  Unfortunately it is likely that patient's colonic function will not recover given length of time with overextension and ileus.  Answered questions to the best my understanding ability, recommended she discuss surgical specific questions with general surgery team.

## 2024-01-11 NOTE — Plan of Care (Signed)

## 2024-01-11 NOTE — Consult Note (Signed)
 Anderson D Doetsch 05-13-2001  [id number].    Requesting MD: Amaryllis Junior, NP Chief Complaint/Reason for Consult: Acute on chronic constipation  HPI: Brent Yang is a 23 y.o. male with a history of intellectual delay, autism, chronic constipation who we are asked to see for possible creation of an ostomy.  Patient seen with RN.  Patient is reportedly nonverbal at baseline.  History obtained from his mother who is at bedside as well as chart review.  Patient admitted on 4/17 after being sent by PCP due to stool burden seen on x-ray.  CT A/P showed distended colon with stool and gas (sigmoid colon measures up to 20cm). GI was consulted.  Patient was started on aggressive bowel regimen.  CT yesterday showed previously noted large volume stool has been largely evacuated; the colon and rectum remain gas and fluid-filled, however, suggesting changes of an underlying colonic ileus; additionally, superimposed circumferential wall thickening of the rectosigmoid colon suggesting changes of an underlying mild proctocolitis. No evidence of obstruction or perforation. No colonoscopy on file. We were asked to see.   His current bowel regimen includes daily Dulcolax suppository, 17 g MiraLAX  3 times daily, senna twice daily, smog every 8 hours. Pt did have vomiting earlier this am. Per report patient has had 2 bowel movements today and tolerated his last meal without vomiting.  His mother reports that she usually notices his abdomen is bothering him when he walks differently or is more distended which were not reported to be noted today. Afebrile. WBC wnl on last check. He is not on blood thinners prior to admission. No prior abdominal surgeries.   ROS: ROS As above  History reviewed. No pertinent family history.  Past Medical History:  Diagnosis Date   Allergic rhinitis    Autism    Intellectual delay    Peanut allergy     Past Surgical History:  Procedure Laterality Date   dental  extractions     with anesthesia   DENTAL RESTORATION/EXTRACTION WITH X-RAY N/A 09/24/2023   Procedure: XRAY EXAM, FULL MOUTH DEBRIDEMENT, FILLING ON 2O, 14OBL, 15O, 19DOB; EXTRACTION OF TOOTH THREE;  Surgeon: Aida Alexander, DMD;  Location: [facility] OR;  Service: Dentistry;  Laterality: N/A;    Social History:  reports that he has never smoked. He has never been exposed to tobacco smoke. He has never used smokeless tobacco. He reports that he does not drink alcohol and does not use drugs.  Allergies:  Allergies  Allergen Reactions   Peanuts [Peanut Oil] Other (See Comments)    Per allergy test    Medications Prior to Admission  Medication Sig Dispense Refill   amantadine  (SYMMETREL ) 100 MG capsule GIVE 1 CAPSULE BY MOUTH TWICE A DAY(8AM+4PM) FOR EPS PREVENTION (Patient taking differently: Take 100 mg by mouth at bedtime. GIVE 1 CAPSULE BY MOUTH TWICE A DAY(8AM+4PM) FOR EPS PREVENTION) 60 capsule 1   Carbamazepine  (EQUETRO ) 300 MG CP12 Take 1 capsule (300 mg total) by mouth 2 (two) times daily. (Patient taking differently: Take 300 mg by mouth at bedtime.) 60 capsule 1   EPINEPHRINE  0.3 mg/0.3 mL IJ SOAJ injection INJECT CONTENTS OF ONE DEVICE, 0.3 ML, INTO MUSCLE IN EVENT OF ANAPHYLAXIS 2 each 1   gabapentin  (NEURONTIN ) 400 MG capsule Take 400 mg by mouth at bedtime.     paliperidone  (INVEGA ) 9 MG 24 hr tablet TAKE 1 TABLET BY MOUTH DAILY AT 8AM FOR AGGRESSION/MOOD (Patient taking differently: Take 9 mg by mouth at bedtime.) 30  tablet 1   polyethylene glycol powder (GLYCOLAX /MIRALAX ) 17 GM/SCOOP powder Mix 1 capful in 8 oz liquid and drink once daily to promote soft stools; decrease dose to 1/2 capful if stools too loose (Patient taking differently: Take 17 g by mouth 4 (four) times a week. Mix 1 capful in 8 oz liquid and drink  to promote soft stools; decrease dose to 1/2 capful if stools too loose) 500 g 6   propranolol  (INDERAL ) 40 MG tablet Take 1 tablet (40 mg total) by mouth 2 (two) times daily. 60  tablet 2     Physical Exam: Blood pressure (!) 127/92, pulse 68, temperature 98.7 F (37.1 C), resp. rate 18, height 6\' 2"  (1.88 m), weight 92.3 kg, SpO2 100%. General: pleasant, WD/WN male who is laying in bed in NAD HEENT: head is normocephalic, atraumatic.   Lungs: Respiratory effort nonlabored Abd: Soft, ND, NT, +BS Skin: warm and dry    Results for orders placed or performed during the hospital encounter of 12/30/23 (from the past 48 hours)  Basic metabolic panel with GFR     Status: Abnormal   Collection Time: 01/10/24  4:15 AM  Result Value Ref Range   Sodium 140 135 - 145 mmol/L   Potassium 3.9 3.5 - 5.1 mmol/L   Chloride 104 98 - 111 mmol/L   CO2 27 22 - 32 mmol/L   Glucose, Bld 122 (H) 70 - 99 mg/dL    Comment: Glucose reference range applies only to samples taken after fasting for at least 8 hours.   BUN 7 6 - 20 mg/dL   Creatinine, Ser 2.35 0.61 - 1.24 mg/dL   Calcium 9.5 8.9 - 57.3 mg/dL   GFR, Estimated >22 >02 mL/min    Comment: (NOTE) Calculated using the CKD-EPI Creatinine Equation (2021)    Anion gap 9 5 - 15    Comment: Performed at [facility], [address] [place], [place] [address]  Basic metabolic panel     Status: Abnormal   Collection Time: 01/11/24  5:55 AM  Result Value Ref Range   Sodium 139 135 - 145 mmol/L   Potassium 3.9 3.5 - 5.1 mmol/L   Chloride 100 98 - 111 mmol/L   CO2 31 22 - 32 mmol/L   Glucose, Bld 118 (H) 70 - 99 mg/dL    Comment: Glucose reference range applies only to samples taken after fasting for at least 8 hours.   BUN 8 6 - 20 mg/dL   Creatinine, Ser 6.23 (H) 0.61 - 1.24 mg/dL   Calcium 9.7 8.9 - 76.2 mg/dL   GFR, Estimated >83 >15 mL/min    Comment: (NOTE) Calculated using the CKD-EPI Creatinine Equation (2021)    Anion gap 8 5 - 15    Comment: Performed at [facility], [address] [place], [place] [address]   CT ABDOMEN PELVIS WO CONTRAST Result Date: 01/10/2024 CLINICAL DATA:  Bowel obstruction  EXAM: CT ABDOMEN AND PELVIS WITHOUT CONTRAST TECHNIQUE: Multidetector CT imaging of the abdomen and pelvis was performed following the standard protocol without IV contrast. RADIATION DOSE REDUCTION: This exam was performed according to the departmental dose-optimization program which includes automated exposure control, adjustment of the mA and/or kV according to patient size and/or use of iterative reconstruction technique. COMPARISON:  None Available. FINDINGS: Lower chest: No acute abnormality. Hepatobiliary: No focal liver abnormality is seen. No gallstones, gallbladder wall thickening, or biliary dilatation. Pancreas: Unremarkable. No pancreatic ductal dilatation or surrounding inflammatory changes. Spleen:  Normal in size without focal abnormality. Adrenals/Urinary Tract: Adrenal glands are unremarkable. Kidneys are normal, without renal calculi, focal lesion, or hydronephrosis. Bladder is unremarkable. Stomach/Bowel: The rectosigmoid colon is redundant. Previously noted large volume stool has been largely evacuated. The colon and rectum remain gas and fluid-filled, however, suggesting changes of an underlying colonic ileus. There is, additionally, superimposed circumferential wall thickening of the rectosigmoid colon suggesting changes of an underlying mild proctocolitis. No evidence of obstruction. No free intraperitoneal gas or fluid. The stomach, small bowel and large are otherwise unremarkable. Appendix normal. Vascular/Lymphatic: No significant vascular findings are present. No enlarged abdominal or pelvic lymph nodes. Reproductive: Prostate is unremarkable. Other: No abdominal wall hernia or abnormality. No abdominopelvic ascites. Musculoskeletal: No acute or significant osseous findings. IMPRESSION: 1. Previously noted large volume stool has been largely evacuated. The colon and rectum remain gas and fluid-filled, however, suggesting changes of an underlying colonic ileus. There is, additionally,  superimposed circumferential wall thickening of the rectosigmoid colon suggesting changes of an underlying mild proctocolitis. No evidence of obstruction or perforation. Electronically Signed   By: Worthy Heads M.D.   On: 01/10/2024 21:53   DG Abd 1 View Result Date: 01/10/2024 CLINICAL DATA:  Constipation EXAM: ABDOMEN - 1 VIEW COMPARISON:  01/09/2024 FINDINGS: Persistent gaseous distension of the colon. The degree of fecal material within the colon has improved when compared with the prior exam. No bony abnormality is noted. IMPRESSION: Persistent gaseous distension of the stomach although the fecal burden has decreased. Electronically Signed   By: Violeta Grey M.D.   On: 01/10/2024 10:40    Anti-infectives (From admission, onward)    None       Assessment/Plan Bonifacio D Galano is a 23 y.o. male with a history of intellectual delay, autism, chronic constipation who was admitted on 4/17 for acute on chronic constipation.  He was treated with aggressive bowel regimen by GI.  His follow-up CT scan yesterday showed previously noted large volume stool has been largely evacuated; the colon and rectum remain gas and fluid-filled, suggesting changes of an underlying colonic ileus without evidence of obstruction or perforation; additionally, superimposed circumferential wall thickening of the rectosigmoid colon suggesting changes of an underlying mild proctocolitis.   Patient is currently hemodynamically stable without fever, tachycardia or hypotension.  Last WBC within normal limits.  His abdomen is flat/nondistended and nontender on my exam.  Per report patient is now tolerating p.o.'s and has had BMs today.  Reviewed the case with my attending.  Given underlying constipation likely at least in part from chronic dysmotility and current picture of colonic ileus contributing to this, we do not recommend a surgery for creation of a colostomy at this time.  I do not think this would fix the patient's  issue with his colonic dysmotility and, given patient's social situation, may be more difficult for him to care for.  Agree with GI continue to treat his colonic ileus/chronic dysmotility. We will sign off. Please call back with questions or concerns.   I reviewed nursing notes, Consultant GI notes, hospitalist notes, last 24 h vitals and pain scores, last 48 h intake and output, last 24 h labs and trends, and last 24 h imaging results.  Delton Filbert, [facility] Surgery 01/11/2024, 1:57 PM Please see Amion for pager number during day hours 7:00am-4:30pm

## 2024-01-11 NOTE — Assessment & Plan Note (Addendum)
 Repeat CT yesterday with improvement in stool burden, however patient still has significantly distended bowel.  Concern that colon has been distended for so long now that this will not resolve on its own.  Possibly still surgical indication.  Will follow with GI recs. - Dulcolax suppository daily - Miralax  4 times daily - Senna BID - SMOG enema TID - GI following, appreciate recs  - Strict I/O - AM BMP

## 2024-01-11 NOTE — Assessment & Plan Note (Addendum)
 Autism/behavioral disturbances - continue home carbamazepine  300 mg BID, paliperidone  9 mg daily, amantadine  100 mg daily, propranolol  40 mg BID

## 2024-01-12 DIAGNOSIS — K5900 Constipation, unspecified: Secondary | ICD-10-CM | POA: Diagnosis not present

## 2024-01-12 LAB — BASIC METABOLIC PANEL WITH GFR
Anion gap: 10 (ref 5–15)
BUN: 11 mg/dL (ref 6–20)
CO2: 27 mmol/L (ref 22–32)
Calcium: 9.4 mg/dL (ref 8.9–10.3)
Chloride: 99 mmol/L (ref 98–111)
Creatinine, Ser: 1.24 mg/dL (ref 0.61–1.24)
GFR, Estimated: >60 mL/min (ref >60)
Glucose, Bld: 102 mg/dL — ABNORMAL HIGH (ref 70–99)
Potassium: 3.9 mmol/L (ref 3.5–5.1)
Sodium: 136 mmol/L (ref 135–145)

## 2024-01-12 NOTE — Plan of Care (Signed)

## 2024-01-12 NOTE — Assessment & Plan Note (Addendum)
 Seen by general surgery yesterday - do not recommend surgical intervention (including colostomy) at this time as this would not necessarily prevent future episodes like this or improve his quality of life given the additional care burden it would cause, and patient is unable to participate in his care.  Will continue to follow-up with GI recs for possible stepdown of aggressive bowel regimen. - GI following, appreciate recs  - Dulcolax suppository daily - Miralax  4 times daily - Senna BID - Discontinued SMOG enema yesterday 4/28 - Strict I/O - AM BMP

## 2024-01-12 NOTE — TOC Progression Note (Signed)
 Transition of Care [facility]) - Progression Note    Patient Details  Name: Brent Yang MRN: [id number] Date of Birth: 05/02/2001  [facility]) CM/SW Contact  Juliane Och, LCSW Phone Number: 01/12/2024, 5:16 PM  Clinical Narrative:     5:16 PM Per APS social worker, Dalbert Dubois, there are no barriers to patient returning home bedsides medical readiness. CSW made medical team aware.  Expected Discharge Plan: Home/Self Care Barriers to Discharge: Continued Medical Work up  Expected Discharge Plan and Services       Living arrangements for the past 2 months: Single Family Home                                       Social Determinants of Health (SDOH) Interventions SDOH Screenings   Food Insecurity: Patient Unable To Answer (12/31/2023)  Housing: Patient Unable To Answer (12/31/2023)  Transportation Needs: Patient Unable To Answer (12/31/2023)  Utilities: Patient Unable To Answer (12/31/2023)  Depression (PHQ2-9): Medium Risk (03/09/2023)  Tobacco Use: Low Risk  (12/30/2023)    Readmission Risk Interventions     No data to display

## 2024-01-12 NOTE — Progress Notes (Signed)
     Daily Progress Note Intern Pager: [phone]  Patient name: Brent Yang Medical record number: [id number] Date of birth: [birth year] Age: 23 y.o. Gender: male  Primary Care Provider: Albin Huh, MD Consultants: GI, General Surgery (s/o) Code Status: FULL  Pt Overview and Major Events to Date:  4/16 - admitted; CT showed massively distended colon with stool and gas 4/27 - repeat CT with improvement in stool burden, chronic ileus 4/28 - GS consulted and s/o, colostomy not indicated  Assessment and Plan: Brent Yang is a 23 y.o. male with history of ASD, intellectual delay.  Admitted for severe fecal impaction and massively distended colon, receiving aggressive bowel regimen.  Repeat CT with improvement in stool burden, and no need for surgical intervention per surgery at this time. Assessment & Plan Constipation Seen by general surgery yesterday - do not recommend surgical intervention (including colostomy) at this time as this would not necessarily prevent future episodes like this or improve his quality of life given the additional care burden it would cause, and patient is unable to participate in his care.  Will continue to follow-up with GI recs for possible stepdown of aggressive bowel regimen. - GI following, appreciate recs  - Dulcolax suppository daily - Miralax  4 times daily - Senna BID - Discontinued SMOG enema yesterday 4/28 - Strict I/O - AM BMP Chronic health problem Autism/behavioral disturbances - continue home carbamazepine  300 mg BID, paliperidone  9 mg daily, amantadine  100 mg daily, propranolol  40 mg BID   FEN/GI: Regular PPx: Lovenox  Dispo:Home pending clinical improvement .   Subjective:  Seen this morning lying in bed sleeping.  Awakens briefly and grunts, then goes back to sleep.  Objective: Temp:  [98.3 F (36.8 C)-98.8 F (37.1 C)] 98.4 F (36.9 C) (04/29 0615) Pulse Rate:  [62-70] 69 (04/29 0615) Resp:  [18-20] 20 (04/29 0615) BP:  (86-112)/(51-83) 95/62 (04/29 0615) SpO2:  [97 %-100 %] 98 % (04/29 0615) Physical Exam: General: Lying in bed sleeping.  No apparent distress or discomfort.  Breakfast tray at bedside, and eaten. Respiratory: Normal work of breathing on room air. Abdomen: Nondistended  Laboratory: Most recent CBC Lab Results  Component Value Date   WBC 4.7 01/03/2024   HGB 12.4 (L) 01/03/2024   HCT 36.5 (L) 01/03/2024   MCV 93.6 01/03/2024   PLT 251 01/03/2024   Most recent BMP    Latest Ref Rng & Units 01/12/2024    4:25 AM  BMP  Glucose 70 - 99 mg/dL 147   BUN 6 - 20 mg/dL 11   Creatinine 8.29 - 1.24 mg/dL 5.62   Sodium 130 - 865 mmol/L 136   Potassium 3.5 - 5.1 mmol/L 3.9   Chloride 98 - 111 mmol/L 99   CO2 22 - 32 mmol/L 27   Calcium 8.9 - 10.3 mg/dL 9.4      Omar Bibber, DO 01/12/2024, 11:51 AM  PGY-1, [place] [facility] FPTS Intern pager: [phone], text pages welcome Secure chat group [facility] Teaching Service

## 2024-01-12 NOTE — Assessment & Plan Note (Addendum)
 Autism/behavioral disturbances - continue home carbamazepine  300 mg BID, paliperidone  9 mg daily, amantadine  100 mg daily, propranolol  40 mg BID

## 2024-01-12 NOTE — Care Plan (Signed)
 Discussed discharge regimen with Maureen Sour, NP from GI. She recommend patient discharge on Miralax  BID with Dulcolax suppository 1-2 times daily.

## 2024-01-12 NOTE — Plan of Care (Signed)

## 2024-01-13 DIAGNOSIS — K5900 Constipation, unspecified: Secondary | ICD-10-CM | POA: Diagnosis not present

## 2024-01-13 LAB — BASIC METABOLIC PANEL WITH GFR
Anion gap: 10 (ref 5–15)
BUN: 16 mg/dL (ref 6–20)
CO2: 27 mmol/L (ref 22–32)
Calcium: 8.9 mg/dL (ref 8.9–10.3)
Chloride: 98 mmol/L (ref 98–111)
Creatinine, Ser: 1.15 mg/dL (ref 0.61–1.24)
GFR, Estimated: >60 mL/min (ref >60)
Glucose, Bld: 105 mg/dL — ABNORMAL HIGH (ref 70–99)
Potassium: 3.8 mmol/L (ref 3.5–5.1)
Sodium: 135 mmol/L (ref 135–145)

## 2024-01-13 MED ORDER — POLYETHYLENE GLYCOL 3350 17 G PO PACK
17.0000 g | PACK | Freq: Two times a day (BID) | ORAL | Status: DC
  Administered 2024-01-13 – 2024-01-15 (×4): 17 g via ORAL
  Filled 2024-01-13 (×4): qty 1

## 2024-01-13 MED ORDER — BISACODYL 10 MG RE SUPP
10.0000 mg | Freq: Two times a day (BID) | RECTAL | Status: DC
  Administered 2024-01-13 – 2024-01-15 (×4): 10 mg via RECTAL
  Filled 2024-01-13 (×4): qty 1

## 2024-01-13 NOTE — TOC Progression Note (Addendum)
 Transition of Care [facility]) - Progression Note    Patient Details  Name: Brent Yang MRN: [id number] Date of Birth: 06/13/2001  Transition of Care Brent Yang) CM/SW Contact  Brent Och, LCSW Phone Number: 01/13/2024, 12:27 PM  Clinical Narrative:     12:27 PM Resident informed CSW of GI regiment recommendation of MiraLAX  2 times daily and Dulcolax suppository 2 times daily. Resident stated that regimen is to begin today in hopes of patient discharge home tomorrow based on tolerance. No GI outpatient follow up has been scheduled/requested at this time. CSW relayed information to APS social worker, Brent Yang. Brent Yang expressed concerns of regimen as patient has history of non-compliance with MiraLAX . CSW relayed concerns to Resident and inquired suggestions from Brent Yang.  4:15 PM Brent Yang inquired about home services for patient to assist patient's mother in administering medications. CSW consulted RNCM who informed CSW and medical team that home aides/respite care services cannot administer medications. Per resident, patient has been compliant with medications here. CSW relayed information to Brent Yang. Brent Yang expressed interest in patient remaining in hospital until home services are obtained to assist with medication administration. CSW relayed this to medical team.   4:24 PM Brent Yang expressed concerns of patient becoming non-compliant with medications at home upon discharge and will readmit. CSW relayed concern to medical team.  Expected Discharge Plan: Home/Self Care Barriers to Discharge: Continued Medical Work up  Expected Discharge Plan and Services       Living arrangements for the past 2 months: Single Family Home                                       Social Determinants of Health (SDOH) Interventions SDOH Screenings   Food Insecurity: Patient Unable To Answer (12/31/2023)  Housing: Patient Unable To Answer (12/31/2023)  Transportation Needs: Patient Unable To Answer  (12/31/2023)  Utilities: Patient Unable To Answer (12/31/2023)  Depression (PHQ2-9): Medium Risk (03/09/2023)  Tobacco Use: Low Risk  (12/30/2023)    Readmission Risk Interventions     No data to display

## 2024-01-13 NOTE — Progress Notes (Signed)
     Daily Progress Note Intern Pager: [phone]  Patient name: Brent Yang Medical record number: [id number] Date of birth: [birth year] Age: 23 y.o. Gender: male  Primary Care Provider: Albin Huh, MD Consultants: GI, general surgery (s/o) Code Status: Full  Pt Overview and Major Events to Date:  4/16 - admitted; CT showed massively distended colon with stool and gas 4/27 - repeat CT with improvement in stool burden, chronic ileus 4/28 - GS consulted and s/o, colostomy not indicated  Assessment and Plan: Brent Yang is a 23 y.o. male with history of ASD, intellectual delay.  Admitted for severe fecal impaction and massively distended colon, receiving aggressive bowel regimen.  Repeat CT with improvement in stool burden, and no need for surgical intervention, hopefully nearing readiness for discharge.   Assessment & Plan Constipation Patient continues to improve, appears comfortable on exam.  Hopefully approaching readiness for discharge home with plan for bowel regimen. - GI following, appreciate recs: - Should discharge on bowel regimen of MiraLAX  2 times daily, Dulcolax suppositories 2 times daily.  Will initiate this regimen today; if all goes well patient may be appropriate to discharge tomorrow. - Senna BID - Strict I/O - AM BMP Chronic health problem Autism/behavioral disturbances - continue home carbamazepine  300 mg BID, paliperidone  9 mg daily, amantadine  100 mg daily, propranolol  40 mg BID   FEN/GI: Regular diet PPx: Lovenox  Dispo:Home pending clinical improvement .   Subjective:  Patient seen this morning sitting up in bed comfortably, watching TV.  He has a breakfast tray at bedside, eaten.  Objective: Temp:  [98 F (36.7 C)-98.8 F (37.1 C)] 98 F (36.7 C) (04/30 0742) Pulse Rate:  [64-85] 85 (04/30 0742) Resp:  [18-19] 19 (04/30 0509) BP: (96-112)/(59-78) 112/72 (04/30 0742) SpO2:  [99 %-100 %] 99 % (04/30 0742) Physical Exam: General:  Well-appearing, no apparent discomfort Cardiovascular: RRR Respiratory: Normal work of breathing on room air Abdomen: Soft, no apparent tenderness Extremities: Moves all equally  Laboratory: Most recent CBC Lab Results  Component Value Date   WBC 4.7 01/03/2024   HGB 12.4 (L) 01/03/2024   HCT 36.5 (L) 01/03/2024   MCV 93.6 01/03/2024   PLT 251 01/03/2024   Most recent BMP    Latest Ref Rng & Units 01/13/2024   12:08 AM  BMP  Glucose 70 - 99 mg/dL 147   BUN 6 - 20 mg/dL 16   Creatinine 8.29 - 1.24 mg/dL 5.62   Sodium 130 - 865 mmol/L 135   Potassium 3.5 - 5.1 mmol/L 3.8   Chloride 98 - 111 mmol/L 98   CO2 22 - 32 mmol/L 27   Calcium 8.9 - 10.3 mg/dL 8.9     Omar Bibber, DO 01/13/2024, 12:27 PM  PGY-1, [facility] Family Medicine FPTS Intern pager: [phone], text pages welcome Secure chat group [facility] Teaching Service

## 2024-01-13 NOTE — Plan of Care (Signed)
 Pt taking a shower this evening. Family present in room and assisting with care. No complaints from the pt. Encouraged to call or notify staff for assistance. Call light in reach. Sr x2 elevated. Bed in low position.

## 2024-01-13 NOTE — Discharge Instructions (Addendum)
 Dear Brent Yang,  Thank you for letting us  participate in your care. You were hospitalized for severe constipation. You were treated with oral medications, rectal suppositories, and enemas.  It is very important that you take all of your medications as prescribed, particularly your bowel regimen medications, in order to prevent this from happening again:  1 capful of MiraLAX  2 times a day (morning and evening) Dulcolax rectal suppositories 2 times a day (morning and evening) Senna 2 times a day (morning and evening)  If you have any questions about your bowel regimen, please contact your primary care doctor and ask.  Please do not stop taking any of these medications without letting your primary care doctor know.  POST-HOSPITAL & CARE INSTRUCTIONS Please follow-up with your primary care doctor within 1 week of being discharged from the hospital.  If you have any questions about how to take your medications, please ask.   DOCTOR'S APPOINTMENT   Future Appointments  Date Time Provider Department Center  01/21/2024  9:10 AM Limmie Ren, MD [facility]     Take care and be well!  Family Medicine Teaching Service Inpatient Team [place]  [facility]  [address] [place], [place] [address] [phone]

## 2024-01-13 NOTE — Care Plan (Signed)
 Called patient's mother, Creta Dolin, at her request.  Answered her questions about status of patient's care. Advised her that given improvement on recent CT we are no longer doing enemas, but rather proceeding with MiraLAX , senna, Dulcolax suppositories.  She inquired if this plan was in accordance with recommendations from GI, which it is.  She requested that I transfer her to the GI doctor she spoke with on Sunday.  I advised her that I was unable to do that.  She stated she was not interested in any additional information from me and would like to hear from the GI doctors.  I advised her that I would pass along her request to the GI team.

## 2024-01-13 NOTE — Assessment & Plan Note (Addendum)
 Patient continues to improve, appears comfortable on exam.  Hopefully approaching readiness for discharge home with plan for bowel regimen. - GI following, appreciate recs: - Should discharge on bowel regimen of MiraLAX  2 times daily, Dulcolax suppositories 2 times daily.  Will initiate this regimen today; if all goes well patient may be appropriate to discharge tomorrow. - Senna BID - Strict I/O - AM BMP

## 2024-01-13 NOTE — Plan of Care (Signed)

## 2024-01-13 NOTE — Assessment & Plan Note (Addendum)
 Autism/behavioral disturbances - continue home carbamazepine  300 mg BID, paliperidone  9 mg daily, amantadine  100 mg daily, propranolol  40 mg BID

## 2024-01-14 ENCOUNTER — Telehealth: Payer: Self-pay

## 2024-01-14 DIAGNOSIS — F840 Autistic disorder: Secondary | ICD-10-CM | POA: Diagnosis not present

## 2024-01-14 DIAGNOSIS — K5641 Fecal impaction: Secondary | ICD-10-CM | POA: Diagnosis not present

## 2024-01-14 DIAGNOSIS — K6389 Other specified diseases of intestine: Secondary | ICD-10-CM | POA: Diagnosis not present

## 2024-01-14 NOTE — Progress Notes (Signed)
 Pt's mother here. She received instructions regarding mixing the Miralax  and preparing the suppository. Return demonstrations successful.

## 2024-01-14 NOTE — Assessment & Plan Note (Addendum)
 Patient continues to improve, appears comfortable on exam.  Still with several stools a day.  Good appetite.  Hopefully approaching readiness for discharge with plan for bowel regimen.  Daily BMP has been stable and patient is no longer vomiting, so will not continue with daily labs. - GI following, appreciate recs: - MiraLAX  twice daily -Dulcolax suppository twice daily - Senna BID - Strict I/O - Continue weaning amantadine  as this may be contributing to constipation; currently on 100 mg daily and planning to d/c this 5/3.

## 2024-01-14 NOTE — Progress Notes (Addendum)
     Daily Progress Note Intern Pager: [phone]  Patient name: Brent Yang Medical record number: [id number] Date of birth: 08-02-2001 Age: 23 y.o. Gender: male  Primary Care Provider: Albin Huh, MD Consultants: GI, general surgery (s/o) Code Status: Full  Pt Overview and Major Events to Date:  4/16 - admitted; CT showed massively distended colon with stool and gas 4/27 - repeat CT with improvement in stool burden, chronic ileus 4/28 - GS consulted and s/o, colostomy not indicated 4/30 - discontinued enemas; bowel regimen MiraLAX  BID, Dulcolax suppository BID, senna BID  Assessment and Plan: Brent Yang is a 23 y.o. male with history of ASD, intellectual delay.  Admitted for severe fecal impaction and massively distended colon, receiving aggressive bowel regimen.  Repeat CT with improvement in stool burden, and no need for surgical intervention, hopefully nearing readiness for discharge pending safe dispo plan.  Assessment & Plan Constipation Patient continues to improve, appears comfortable on exam.  Still with several stools a day.  Good appetite.  Hopefully approaching readiness for discharge with plan for bowel regimen.  Daily BMP has been stable and patient is no longer vomiting, so will not continue with daily labs. - GI following, appreciate recs: - MiraLAX  twice daily -Dulcolax suppository twice daily - Senna BID - Strict I/O - Continue weaning amantadine  as this may be contributing to constipation; currently on 100 mg daily and planning to d/c this 5/3. Chronic health problem Autism/behavioral disturbances - continue home carbamazepine  300 mg BID, paliperidone  9 mg daily, amantadine  100 mg daily, propranolol  40 mg BID   FEN/GI: Regular diet PPx: Lovenox  Dispo: Pending safe dispo plan with appropriate bowel regimen.  Subjective:  Patient seen this morning lying in bed sleeping but awakens to voice.  He shows interest in breakfast tray at bedside and sits up  to eat.  No apparent discomfort.  Objective: Temp:  [97.6 F (36.4 C)-98 F (36.7 C)] 98 F (36.7 C) (05/01 0837) Pulse Rate:  [69-83] 69 (05/01 0837) Resp:  [16-18] 18 (05/01 0837) BP: (95-103)/(62-75) 95/62 (05/01 0837) SpO2:  [98 %-100 %] 98 % (05/01 0837) Physical Exam: General: Lying in bed but awakens easily, begins to eat his breakfast. Cardiovascular: RRR Respiratory: CTA bilaterally with normal work of breathing on room air Abdomen: Normoactive bowel sounds, soft, no apparent tenderness Extremities: Moves all equally  Laboratory: Most recent CBC Lab Results  Component Value Date   WBC 4.7 01/03/2024   HGB 12.4 (L) 01/03/2024   HCT 36.5 (L) 01/03/2024   MCV 93.6 01/03/2024   PLT 251 01/03/2024   Most recent BMP    Latest Ref Rng & Units 01/13/2024   12:08 AM  BMP  Glucose 70 - 99 mg/dL 578   BUN 6 - 20 mg/dL 16   Creatinine 4.69 - 1.24 mg/dL 6.29   Sodium 528 - 413 mmol/L 135   Potassium 3.5 - 5.1 mmol/L 3.8   Chloride 98 - 111 mmol/L 98   CO2 22 - 32 mmol/L 27   Calcium 8.9 - 10.3 mg/dL 8.9     Omar Bibber, DO 01/14/2024, 10:50 AM  PGY-1, [facility] FPTS Intern pager: [phone], text pages welcome Secure chat group [facility] Teaching Service

## 2024-01-14 NOTE — TOC Progression Note (Signed)
 Transition of Care [facility]) - Progression Note    Patient Details  Name: Brent Yang MRN: [id number] Date of Birth: May 31, 2001  [facility]) CM/SW Contact  Juliane Och, LCSW Phone Number: 01/14/2024, 11:45 AM  Clinical Narrative:     11:45 AM Per medical resident, patient's mother, Creta Dolin, informed medical team that patient was not non-compliant with medication at home, but was not receiving it. CSW informed APS social worker, [place].  Expected Discharge Plan: Home/Self Care Barriers to Discharge: Continued Medical Work up  Expected Discharge Plan and Services       Living arrangements for the past 2 months: Single Family Home                                       Social Determinants of Health (SDOH) Interventions SDOH Screenings   Food Insecurity: Patient Unable To Answer (12/31/2023)  Housing: Patient Unable To Answer (12/31/2023)  Transportation Needs: Patient Unable To Answer (12/31/2023)  Utilities: Patient Unable To Answer (12/31/2023)  Depression (PHQ2-9): Medium Risk (03/09/2023)  Tobacco Use: Low Risk  (12/30/2023)    Readmission Risk Interventions     No data to display

## 2024-01-14 NOTE — Plan of Care (Signed)
  Problem: Education: Goal: Knowledge of General Education information will improve Description: Including pain rating scale, medication(s)/side effects and non-pharmacologic comfort measures Outcome: Progressing   Problem: Health Behavior/Discharge Planning: Goal: Ability to manage health-related needs will improve Outcome: Progressing   Problem: Clinical Measurements: Goal: Ability to maintain clinical measurements within normal limits will improve Outcome: Progressing Goal: Will remain free from infection Outcome: Progressing Goal: Diagnostic test results will improve Outcome: Progressing Goal: Respiratory complications will improve Outcome: Progressing Goal: Cardiovascular complication will be avoided Outcome: Progressing   Problem: Activity: Goal: Risk for activity intolerance will decrease Outcome: Progressing   Problem: Nutrition: Goal: Adequate nutrition will be maintained Outcome: Progressing   Problem: Coping: Goal: Level of anxiety will decrease Outcome: Progressing   Problem: Elimination: Goal: Will not experience complications related to urinary retention Outcome: Progressing   Problem: Pain Managment: Goal: General experience of comfort will improve and/or be controlled Outcome: Progressing   Problem: Safety: Goal: Ability to remain free from injury will improve Outcome: Progressing   Problem: Skin Integrity: Goal: Risk for impaired skin integrity will decrease Outcome: Progressing

## 2024-01-14 NOTE — Plan of Care (Signed)
 Pt alert and resting in bed. No complaints. Skin warm and dry. Watching tv at times. No n/v/d noted. Will continue to monitor. Call light in reach. Sr x2 elevated. Bed in low position.staff is expecting the pt's mother to come this evening to receive teaching r/t the administration of home meds.

## 2024-01-14 NOTE — Assessment & Plan Note (Signed)
 Autism/behavioral disturbances - continue home carbamazepine  300 mg BID, paliperidone  9 mg daily, amantadine  100 mg daily, propranolol  40 mg BID

## 2024-01-14 NOTE — Telephone Encounter (Signed)
 Patient's mother calls nurse line requesting to speak with Dr. Irby Mannan regarding son's care and plan over at the hospital.   Advised mother that she should contact hospital to have provider paged that is taking care of patient. She said that she spoke with one of the nurses over there that has sent provider a message. She states, " if they are able to send the doctor a message, you should be able to do so as well."  She states that she would like to speak "with the doctor that sent us  to the hospital."  Per chart review, patient was recently seen by Dr. Irby Mannan on 12/29/23.  Forwarding to Dr. Irby Mannan and inpatient team.   Elsie Halo, RN

## 2024-01-15 ENCOUNTER — Other Ambulatory Visit (HOSPITAL_COMMUNITY): Payer: Self-pay

## 2024-01-15 DIAGNOSIS — K6389 Other specified diseases of intestine: Secondary | ICD-10-CM | POA: Diagnosis not present

## 2024-01-15 DIAGNOSIS — F71 Moderate intellectual disabilities: Secondary | ICD-10-CM | POA: Diagnosis not present

## 2024-01-15 MED ORDER — FOLIC ACID 1 MG PO TABS: 1.0000 mg | ORAL_TABLET | Freq: Every day | ORAL | Status: AC

## 2024-01-15 MED ORDER — BISACODYL 10 MG RE SUPP
10.0000 mg | Freq: Two times a day (BID) | RECTAL | 0 refills | Status: DC
  Filled 2024-01-15: qty 12, 6d supply, fill #0

## 2024-01-15 MED ORDER — POLYETHYLENE GLYCOL 3350 17 GM/SCOOP PO POWD
17.0000 g | Freq: Two times a day (BID) | ORAL | 0 refills | Status: DC
  Filled 2024-01-15: qty 238, 7d supply, fill #0

## 2024-01-15 MED ORDER — SENNA 8.6 MG PO TABS
2.0000 | ORAL_TABLET | Freq: Two times a day (BID) | ORAL | 0 refills | Status: DC
  Filled 2024-01-15: qty 120, 30d supply, fill #0

## 2024-01-15 NOTE — Discharge Summary (Addendum)
 Family Medicine Teaching [facility] Discharge Summary  Patient name: Brent Yang Medical record number: [id number] Date of birth: Jan 29, 2001 Age: 23 y.o. Gender: male Date of Admission: 12/30/2023  Date of Discharge: 01/15/2024 Admitting Physician: Omar Bibber, DO  Primary Care Provider: Albin Huh, MD Consultants: GI, general surgery  Indication for Hospitalization: Severe constipation  Brief Hospital Course:  Brent Yang is a 23 y.o.male with a history of autism spectrum disorder, non-verbal and constipation who was admitted to the [facility] Medicine Teaching Service at [facility] for constipation. His hospital course is detailed below:  Constipation CT AP showed massive stool burden to 20 cm in the sigmoid colon.  Was initially not responding to enemas, and manual disimpaction was unsuccessful.  GI was consulted and recommended continued enemas, Dulcolax suppository, MiraLAX , senna.  Serial KUBs showed gradual improvement at first, but then unfortunately did not show much change; as such GI was reconsulted.  Recommended oral Suprep which patient was unable to tolerate.  Repeat CT scan showed improved stool burden, though colon and rectum still distended possibly suggesting colonic ileus.  General surgery was consulted and did not feel like surgery was indicated at this time.  GI recommended discontinuing enemas and continuing patient on miralax  twice daily, Dulcolax suppositories twice daily, senna twice daily.  Patient did well with this regimen, constipation greatly improved, and patient was ready to discharge home.  Additionally, during hospitalization amantadine  thought to be possibly contributing to constipation.  This was decreased from 100 mg twice daily to 100 mg daily, and then was discontinued at discharge.  APS Involvement APS report was made due to concern that this problem had been going on for many months, unaddressed.  Mom did report to the medical team that she had  not been giving daily MiraLAX  for many months.   Other chronic conditions were medically managed with home medications and formulary alternatives as necessary (behavioral disturbance)  PCP Follow-up Recommendations: Follow-up constipation Consider medications and regimen that might contribute to constipation. Per GI, May be a candidate for Motegrity as an outpatient; if started would need to pay attention to mental status and mood as there can be a rare risk of change in mood.   Discharge Diagnoses/Problem List:  Principal Problem:   Constipation Active Problems:   Autism spectrum disorder   Moderate intellectual disability   Chronic health problem   Fecal impaction in rectum (HCC)   Constipation due to slow transit   Colon distention  Disposition: Home  Discharge Condition: Stable  Discharge Exam: General: Sleeping in bed, awakens easily to verbal stimuli, comfortable appearing. Respiratory: Normal work of breathing on room air. Abdomen: Soft, no apparent tenderness, nondistended. Extremities: Moves all equally.  Significant Procedures: None  Significant Labs and Imaging:  No results for input(s): "WBC", "HGB", "HCT", "PLT" in the last 48 hours. No results for input(s): "NA", "K", "CL", "CO2", "GLUCOSE", "BUN", "CREATININE", "CALCIUM", "MG", "PHOS", "ALKPHOS", "AST", "ALT", "ALBUMIN", "PROTEIN" in the last 48 hours.  4/16 CT A/P: IMPRESSION: Massively distended colon with stool and gas. Sigmoid colon is distended to 20 cm with stool.  4/27 CT A/P: IMPRESSION: 1. Previously noted large volume stool has been largely evacuated. The colon and rectum remain gas and fluid-filled, however, suggesting changes of an underlying colonic ileus. There is, additionally, superimposed circumferential wall thickening of the rectosigmoid colon suggesting changes of an underlying mild proctocolitis. No evidence of obstruction or perforation.  Results/Tests Pending at Time of Discharge:  none  Discharge Medications:  Allergies  as of 01/15/2024       Reactions   Peanuts [peanut Oil] Other (See Comments)   Per allergy test        Medication List     STOP taking these medications    amantadine  100 MG capsule Commonly known as: SYMMETREL        TAKE these medications    bisacodyl  10 MG suppository Commonly known as: DULCOLAX Place 1 suppository (10 mg total) rectally 2 (two) times daily.   EPINEPHrine  0.3 mg/0.3 mL Soaj injection Commonly known as: EPI-PEN INJECT CONTENTS OF ONE DEVICE, 0.3 ML, INTO MUSCLE IN EVENT OF ANAPHYLAXIS   Equetro  300 MG Cp12 Generic drug: Carbamazepine  Take 1 capsule (300 mg total) by mouth 2 (two) times daily. What changed: when to take this   folic acid  1 MG tablet Commonly known as: FOLVITE  Take 1 tablet (1 mg total) by mouth daily. Start taking on: Jan 16, 2024   gabapentin  400 MG capsule Commonly known as: NEURONTIN  Take 400 mg by mouth at bedtime.   paliperidone  9 MG 24 hr tablet Commonly known as: INVEGA  TAKE 1 TABLET BY MOUTH DAILY AT 8AM FOR AGGRESSION/MOOD What changed:  how much to take how to take this when to take this additional instructions   polyethylene glycol powder 17 GM/SCOOP powder Commonly known as: GLYCOLAX /MIRALAX  Take 1 capful (17 g) by mouth 2 (two) times daily. What changed:  how much to take how to take this when to take this additional instructions   propranolol  40 MG tablet Commonly known as: INDERAL  Take 1 tablet (40 mg total) by mouth 2 (two) times daily.   senna 8.6 MG Tabs tablet Commonly known as: SENOKOT Take 2 tablets (17.2 mg total) by mouth 2 (two) times daily.        Discharge Instructions: Please refer to Patient Instructions section of EMR for full details.  Patient was counseled important signs and symptoms that should prompt return to medical care, changes in medications, dietary instructions, activity restrictions, and follow up appointments.   Follow-Up  Appointments:  Future Appointments  Date Time Provider Department Center  01/21/2024  9:10 AM Limmie Ren, MD [facility]    Omar Bibber, DO 01/15/2024, 1:05 PM PGY-1, [facility] Family Medicine

## 2024-01-15 NOTE — Assessment & Plan Note (Deleted)
 Autism/behavioral disturbances - continue home carbamazepine  300 mg BID, paliperidone  9 mg daily, amantadine  100 mg daily, propranolol  40 mg BID

## 2024-01-15 NOTE — TOC Transition Note (Signed)
 Transition of Care [facility]) - Discharge Note   Patient Details  Name: Brent Yang MRN: [id number] Date of Birth: [birth year]  [facility]) CM/SW Contact:  Eusebio High, RN Phone Number: 01/15/2024, 12:21 PM   Clinical Narrative:     Anticipate patient will DC to home today. RNCM has called and spoken with Mother. She is comfortable with the Miralax  and suppository administration. She will be picking up Patient at DC. Floor RN has been asked to contact Mother when she can come get patient . No additional TOC needs. RNCM signing off       Barriers to Discharge: Continued Medical Work up   Patient Goals and CMS Choice            Discharge Placement                       Discharge Plan and Services Additional resources added to the After Visit Summary for                                       Social Drivers of Health (SDOH) Interventions SDOH Screenings   Food Insecurity: Patient Unable To Answer (12/31/2023)  Housing: Patient Unable To Answer (12/31/2023)  Transportation Needs: Patient Unable To Answer (12/31/2023)  Utilities: Patient Unable To Answer (12/31/2023)  Depression (PHQ2-9): Medium Risk (03/09/2023)  Tobacco Use: Low Risk  (12/30/2023)     Readmission Risk Interventions     No data to display

## 2024-01-15 NOTE — Assessment & Plan Note (Deleted)
 1 stool documented in last 24hrs. Abdomen soft, no apparent tenderness this AM.   - GI following, appreciate recs: - MiraLAX  twice daily - Dulcolax suppository twice daily - Senna BID - Strict I/O - Continue weaning amantadine  as this may be contributing to constipation; currently on 100 mg daily and planning to d/c this 5/3.

## 2024-01-15 NOTE — TOC Progression Note (Addendum)
 Transition of Care [facility]) - Progression Note    Patient Details  Name: Brent Yang MRN: [id number] Date of Birth: 2001-04-29  Transition of Care [facility]) CM/SW Contact  Juliane Och, LCSW Phone Number: 01/15/2024, 11:30 AM  Clinical Narrative:     11:30 AM Per medical team, patient was compliant with mother's administration of MiraLax  and suppository. Medical team plan to discharge patient home today. Per bedside RN, patient's mother appeared capable and involved in patient's care. CSW relayed information to APS social worker, Civil engineer, contracting, and covering APS social worker, [place]. Shelley Dew confirmed that is hospital deems it safe for patient to discharge home then patient can discharge home. Shelley Dew inquired about home services. CSW informed patient of possibility to follow up with outpatient PCP regarding home services. Shelby expressed understanding of the information. CSW relayed information to medical team and PCP. RNCM stated that insurance will not cover home services and that patient's mother declined needing additional home support, would rather have patient education. Medical team confirmed patient's follow up appointment for 01/21/2024 at 9:10AM. CSW relayed information to APS social worker, Shelley Dew, who confirmed patient is able to return home with patient's mother.  Expected Discharge Plan: Home/Self Care Barriers to Discharge: Continued Medical Work up  Expected Discharge Plan and Services       Living arrangements for the past 2 months: Single Family Home                                       Social Determinants of Health (SDOH) Interventions SDOH Screenings   Food Insecurity: Patient Unable To Answer (12/31/2023)  Housing: Patient Unable To Answer (12/31/2023)  Transportation Needs: Patient Unable To Answer (12/31/2023)  Utilities: Patient Unable To Answer (12/31/2023)  Depression (PHQ2-9): Medium Risk (03/09/2023)  Tobacco Use: Low Risk  (12/30/2023)    Readmission  Risk Interventions     No data to display

## 2024-01-18 ENCOUNTER — Telehealth: Payer: Self-pay

## 2024-01-18 NOTE — Transitions of Care (Post Inpatient/ED Visit) (Signed)
 01/18/2024  Name: Brent Yang MRN: [id number] DOB: 04/28/2001  Today's TOC FU Call Status: Today's TOC FU Call Status:: Successful TOC FU Call Completed TOC FU Call Complete Date: 01/18/24 Patient's Name and Date of Birth confirmed.  Transition Care Management Follow-up Telephone Call Date of Discharge: 01/15/24 Discharge Facility: [facility]) Type of Discharge: Inpatient Admission Primary Inpatient Discharge Diagnosis:: constipation How have you been since you were released from the hospital?: Better Any questions or concerns?: No  Items Reviewed: Did you receive and understand the discharge instructions provided?: Yes Medications obtained,verified, and reconciled?: Yes (Medications Reviewed) Any new allergies since your discharge?: No Dietary orders reviewed?: Yes Do you have support at home?: Yes People in Home [RPT]: parent(s)  Medications Reviewed Today: Medications Reviewed Today     Reviewed by Darrall Ellison, LPN (Licensed Practical Nurse) on 01/18/24 at 1017  Med List Status: <None>   Medication Order Taking? Sig Documenting Provider Last Dose Status Informant  bisacodyl  (DULCOLAX) 10 MG suppository [id number]  Place 1 suppository (10 mg total) rectally 2 (two) times daily. Albin Huh, MD  Active   Carbamazepine  (EQUETRO ) 300 MG CP12 [id number] No Take 1 capsule (300 mg total) by mouth 2 (two) times daily.  Patient taking differently: Take 300 mg by mouth at bedtime.   Afton Albright, MD Past Week Active Mother, Pharmacy Records  EPINEPHRINE  0.3 mg/0.3 mL IJ SOAJ injection [id number] No INJECT CONTENTS OF ONE DEVICE, 0.3 ML, INTO MUSCLE IN EVENT OF ANAPHYLAXIS Carlynn Chiles, MD Unknown Active Mother, Pharmacy Records  folic acid  (FOLVITE ) 1 MG tablet [id number]  Take 1 tablet (1 mg total) by mouth daily. Albin Huh, MD  Active   gabapentin  (NEURONTIN ) 400 MG capsule [id number] No Take 400 mg by mouth at bedtime. [provider] Past Week Active  Mother, Pharmacy Records  paliperidone  (INVEGA ) 9 MG 24 hr tablet [id number] No TAKE 1 TABLET BY MOUTH DAILY AT 8AM FOR AGGRESSION/MOOD  Patient taking differently: Take 9 mg by mouth at bedtime.   Afton Albright, MD Past Week Active Mother, Pharmacy Records  polyethylene glycol powder (GLYCOLAX /MIRALAX ) 17 GM/SCOOP powder [id number]  Take 1 capful (17 g) by mouth 2 (two) times daily. Albin Huh, MD  Active   propranolol  (INDERAL ) 40 MG tablet [id number] No Take 1 tablet (40 mg total) by mouth 2 (two) times daily. Afton Albright, MD Past Week Active Mother, Pharmacy Records  senna (SENOKOT) 8.6 MG TABS tablet [id number]  Take 2 tablets (17.2 mg total) by mouth 2 (two) times daily. Albin Huh, MD  Active   Med List Note Zara Heymann, Sandi Crosby 12/31/23 6160): PATIENT IS AUTISTIC AND NON-VERBAL.            Home Care and Equipment/Supplies: Were Home Health Services Ordered?: NA Any new equipment or medical supplies ordered?: NA  Functional Questionnaire: Do you need assistance with bathing/showering or dressing?: No Do you need assistance with meal preparation?: No Do you need assistance with eating?: No Do you have difficulty maintaining continence: No Do you need assistance with getting out of bed/getting out of a chair/moving?: No Do you have difficulty managing or taking your medications?: No  Follow up appointments reviewed: PCP Follow-up appointment confirmed?: Yes Date of PCP follow-up appointment?: 01/21/24 Follow-up Provider: [facility] Follow-up appointment confirmed?: NA Do you need transportation to your follow-up appointment?: No Do you understand care options if your condition(s) worsen?: Yes-patient verbalized understanding    SIGNATURE Darrall Ellison, LPN [facility] Nurse Health Advisor Direct Dial [phone]

## 2024-01-21 ENCOUNTER — Encounter: Payer: Self-pay | Admitting: Student

## 2024-01-21 ENCOUNTER — Inpatient Hospital Stay: Payer: Self-pay | Admitting: Student

## 2024-01-21 ENCOUNTER — Observation Stay (HOSPITAL_COMMUNITY)
Admission: AD | Admit: 2024-01-21 | Discharge: 2024-01-22 | Disposition: A | Discharge status: Home / Self Care | Payer: MEDICAID | Source: Ambulatory Visit | Attending: Family Medicine | Admitting: Family Medicine

## 2024-01-21 ENCOUNTER — Observation Stay (HOSPITAL_COMMUNITY): Payer: MEDICAID

## 2024-01-21 ENCOUNTER — Ambulatory Visit (INDEPENDENT_AMBULATORY_CARE_PROVIDER_SITE_OTHER): Payer: MEDICAID | Admitting: Student

## 2024-01-21 ENCOUNTER — Encounter: Payer: Self-pay | Admitting: Family Medicine

## 2024-01-21 ENCOUNTER — Encounter (HOSPITAL_COMMUNITY): Payer: Self-pay

## 2024-01-21 VITALS — BP 114/70 | HR 101 | Wt 165.3 lb

## 2024-01-21 DIAGNOSIS — K5900 Constipation, unspecified: Secondary | ICD-10-CM | POA: Insufficient documentation

## 2024-01-21 DIAGNOSIS — R627 Adult failure to thrive: Secondary | ICD-10-CM

## 2024-01-21 DIAGNOSIS — R634 Abnormal weight loss: Secondary | ICD-10-CM | POA: Diagnosis present

## 2024-01-21 DIAGNOSIS — Z79899 Other long term (current) drug therapy: Secondary | ICD-10-CM | POA: Insufficient documentation

## 2024-01-21 DIAGNOSIS — F840 Autistic disorder: Secondary | ICD-10-CM | POA: Diagnosis not present

## 2024-01-21 DIAGNOSIS — Z789 Other specified health status: Secondary | ICD-10-CM

## 2024-01-21 LAB — COMPREHENSIVE METABOLIC PANEL WITH GFR
ALT: 14 U/L (ref 0–44)
AST: 14 U/L — ABNORMAL LOW (ref 15–41)
Albumin: 4.1 g/dL (ref 3.5–5.0)
Alkaline Phosphatase: 65 U/L (ref 38–126)
Anion gap: 8 (ref 5–15)
BUN: 10 mg/dL (ref 6–20)
CO2: 25 mmol/L (ref 22–32)
Calcium: 9.6 mg/dL (ref 8.9–10.3)
Chloride: 107 mmol/L (ref 98–111)
Creatinine, Ser: 0.96 mg/dL (ref 0.61–1.24)
GFR, Estimated: >60 mL/min (ref >60)
Glucose, Bld: 96 mg/dL (ref 70–99)
Potassium: 3.9 mmol/L (ref 3.5–5.1)
Sodium: 140 mmol/L (ref 135–145)
Total Bilirubin: 0.6 mg/dL (ref 0.0–1.2)
Total Protein: 7 g/dL (ref 6.5–8.1)

## 2024-01-21 LAB — CBC
HCT: 40.5 % (ref 39.0–52.0)
Hemoglobin: 14 g/dL (ref 13.0–17.0)
MCH: 32.5 pg (ref 26.0–34.0)
MCHC: 34.6 g/dL (ref 30.0–36.0)
MCV: 94 fL (ref 80.0–100.0)
Platelets: 334 10*3/uL (ref 150–400)
RBC: 4.31 MIL/uL (ref 4.22–5.81)
RDW: 12.1 % (ref 11.5–15.5)
WBC: 4.8 10*3/uL (ref 4.0–10.5)
nRBC: 0 % (ref 0.0–0.2)

## 2024-01-21 LAB — HIV ANTIBODY (ROUTINE TESTING W REFLEX): HIV Screen 4th Generation wRfx: NONREACTIVE

## 2024-01-21 LAB — PHOSPHORUS: Phosphorus: 4.1 mg/dL (ref 2.5–4.6)

## 2024-01-21 LAB — MAGNESIUM: Magnesium: 1.7 mg/dL (ref 1.7–2.4)

## 2024-01-21 MED ORDER — BISACODYL 10 MG RE SUPP
10.0000 mg | Freq: Two times a day (BID) | RECTAL | Status: DC
  Administered 2024-01-21 – 2024-01-22 (×2): 10 mg via RECTAL
  Filled 2024-01-21 (×3): qty 1

## 2024-01-21 MED ORDER — SENNA 8.6 MG PO TABS
2.0000 | ORAL_TABLET | Freq: Two times a day (BID) | ORAL | Status: DC
  Administered 2024-01-21 – 2024-01-22 (×2): 17.2 mg via ORAL
  Filled 2024-01-21 (×3): qty 2

## 2024-01-21 MED ORDER — ACETAMINOPHEN 325 MG PO TABS: 650.0000 mg | ORAL_TABLET | Freq: Four times a day (QID) | ORAL | Status: DC | PRN

## 2024-01-21 MED ORDER — ACETAMINOPHEN 650 MG RE SUPP: 650.0000 mg | Freq: Four times a day (QID) | RECTAL | Status: DC | PRN

## 2024-01-21 MED ORDER — PROPRANOLOL HCL 40 MG PO TABS
40.0000 mg | ORAL_TABLET | Freq: Two times a day (BID) | ORAL | Status: DC
  Administered 2024-01-21 – 2024-01-22 (×3): 40 mg via ORAL
  Filled 2024-01-21 (×4): qty 1

## 2024-01-21 MED ORDER — POLYETHYLENE GLYCOL 3350 17 GM/SCOOP PO POWD
17.0000 g | Freq: Two times a day (BID) | ORAL | Status: DC
  Administered 2024-01-21 – 2024-01-22 (×2): 17 g via ORAL
  Filled 2024-01-21 (×2): qty 119

## 2024-01-21 MED ORDER — FOLIC ACID 1 MG PO TABS
1.0000 mg | ORAL_TABLET | Freq: Every day | ORAL | Status: DC
  Administered 2024-01-21 – 2024-01-22 (×2): 1 mg via ORAL
  Filled 2024-01-21 (×2): qty 1

## 2024-01-21 MED ORDER — PALIPERIDONE ER 6 MG PO TB24
9.0000 mg | ORAL_TABLET | Freq: Every day | ORAL | Status: DC
  Administered 2024-01-21: 9 mg via ORAL
  Filled 2024-01-21 (×2): qty 1

## 2024-01-21 NOTE — TOC Initial Note (Addendum)
 Transition of Care [facility]) - Initial/Assessment Note    Patient Details  Name: Brent Yang MRN: [id number] Date of Birth: 2001/05/05  Transition of Care [facility]) CM/SW Contact:    Juliane Och, LCSW Phone Number: 01/21/2024, 1:55 PM  Clinical Narrative:                  1:55 PM CSW introduced self and role to patient and patient's mother, Creta Dolin, at patient's bedside. Nagina confirmed that patient was tolerating home medicine well upon discharge. CSW inquired about current admission. Nagina then stated that she did not feel comfortable answering CSW questions. CSW relayed informed to APS social worker, [place]. Nichole confirmed that APS case is ongoing and that she planned to visit patient at bedside tomorrow. CSW informed medical team of planned visit. CSW closed TOC consult "assess ongoing APS case."  Expected Discharge Plan: Home/Self Care Barriers to Discharge: Continued Medical Work up   Patient Goals and CMS Choice            Expected Discharge Plan and Services In-house Referral: Clinical Social Work     Living arrangements for the past 2 months: Single Family Home                                      Prior Living Arrangements/Services Living arrangements for the past 2 months: Single Family Home Lives with:: Parents Patient language and need for interpreter reviewed:: Yes        Need for Family Participation in Patient Care: Yes (Comment) Care giver support system in place?: Yes (comment)   Criminal Activity/Legal Involvement Pertinent to Current Situation/Hospitalization: No - Comment as needed  Activities of Daily Living      Permission Sought/Granted Permission sought to share information with : Family Supports Permission granted to share information with : No (Contact information on chart)              Emotional Assessment     Affect (typically observed): Stable, Pleasant, Happy   Alcohol / Substance Use: Not Applicable Psych  Involvement: No (comment)  Admission diagnosis:  Failure to thrive in adult [R62.7] Patient Active Problem List   Diagnosis Date Noted   Failure to thrive in adult 01/21/2024   Colon distention 01/01/2024   Chronic health problem 12/31/2023   Fecal impaction in rectum (HCC) 12/31/2023   Constipation due to slow transit 12/31/2023   Moderate intellectual disability 06/08/2014   Allergic rhinitis 06/13/2013   Constipation 02/24/2013   Autism spectrum disorder 02/23/2013   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 [place] [address] Phone: [phone] Fax: [phone]  [facility] [address] [place] [address] Phone: [phone] Fax: [phone]     Social Drivers of Health (SDOH) Social History: SDOH Screenings   Food Insecurity: No Food Insecurity (01/20/2024)  Housing: Unknown (01/20/2024)  Transportation Needs: No Transportation Needs (01/20/2024)  Utilities: Patient Unable To Answer (12/31/2023)  Alcohol Screen: Low Risk  (01/20/2024)  Depression (PHQ2-9): Medium Risk (03/09/2023)  Financial Resource Strain: Patient Declined (01/20/2024)  Physical Activity: Insufficiently Active (01/20/2024)  Social Connections: Unknown (01/20/2024)  Stress: Patient Declined (01/20/2024)  Tobacco Use: Low Risk  (01/21/2024)   SDOH Interventions:     Readmission Risk Interventions     No data to display
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
## 2024-01-21 NOTE — Assessment & Plan Note (Addendum)
 4/15 in clinic 187 lbs, January in clinic was 185 lbs, 4/23 had weight of 203 lbs in hospital, and today 167/165 lbs in clinic. Did get aggressive bowel clean out in hospital. Concern for 20 lb weight loss in less than a month. -Admit to FMTS, attending Dr. Bevin Bucks, Med-Surg -CBC, CMP, Mag, Phos -KUB -Daily weights -Strict I/Os -Continue home folic acid  1 mg daily -RD consult -Social work consult for APS update -AM CBC, CMP, Mag, Phos
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
## 2024-01-21 NOTE — Patient Instructions (Signed)
 Khalon,  I'm a bit worried that your weight is down and your appetite has not returned. I'd like you to be seen in the hospital to get your electrolytes checked and for them to consider looking into getting you evaluated for other causes of these symptoms. You have a bed: 5C13, please head across the street and go to hospital admitting to get checked in.   Alexa Andrews, MD

## 2024-01-21 NOTE — Plan of Care (Signed)
 FMTS Brief Progress Note  S:Went bedside to see patient. Patient eating dinner, in good spirits, mother with no complaints.    O: There were no vitals taken for this visit.  Gen: NAD, happy Card: RRR, NRMG Resp: CTABL, normal WOB Ext: moving all extremities, normal cap refill  A/P: FTT Went bedside and saw patient eating dinner. Plan to obtain weight tonight and tomorrow to track patient is not losing weight.  - Weigh patient - Plans per day team. - Orders reviewed. Labs for AM ordered, which was adjusted as needed.   Wilhemena Harbour, MD 01/21/2024, 9:33 PM PGY-3, [facility] Night Resident  Please page [phone] with questions.

## 2024-01-21 NOTE — Assessment & Plan Note (Deleted)
 Autism/behavioral disturbances-continue home carbamazepine , mirtazapine , paliperidone 

## 2024-01-21 NOTE — Assessment & Plan Note (Addendum)
 Autism/behavioral disturbances-Nonverbal. Home meds of  carbamazepine  300 mg BID, paliperidone  9 mg daily, propanolol 40 mg BID, gabapentin  400 mg nightly, -- mom has not been giving him these since the hospital discharge for concern of worsening constipation, has not had issues with behavior since discharge. Has only been on melatonin 20 mg nightly. -Restart paliperidone  and propanolol -Hold carbamazepine  and gabapentin , restart as appropriate -Need to find out who is prescribing these medications for patient outpatient/which psychiatrist he follows with

## 2024-01-21 NOTE — Plan of Care (Signed)
 Spoke with mom on phone. Eating breakfast, lunch, and dinner since being discharged. Reports that his BM are improving. Had a big BM Monday, then a big one on Wednesday, and then a small BM today.   Mom is trying to be more diligent about checking his BM to make sure he is having good BM. She has asked him to not flush, so she can check it and he has been compliant with this. Updated mom that we will monitor his weight and PO intake ON. If stable, hopefully can dc tomorrow or in 2 days

## 2024-01-21 NOTE — Assessment & Plan Note (Addendum)
 Having loose BM at home. Home meds of dulcolax suppository BID, miralax  17 g BID, senna 17.2 mg BID.  -Continue home meds -KUB

## 2024-01-22 ENCOUNTER — Telehealth: Payer: Self-pay

## 2024-01-22 DIAGNOSIS — R627 Adult failure to thrive: Secondary | ICD-10-CM | POA: Diagnosis not present

## 2024-01-22 DIAGNOSIS — R634 Abnormal weight loss: Secondary | ICD-10-CM | POA: Diagnosis not present

## 2024-01-22 LAB — COMPREHENSIVE METABOLIC PANEL WITH GFR
ALT: 13 U/L (ref 0–44)
AST: 12 U/L — ABNORMAL LOW (ref 15–41)
Albumin: 3.3 g/dL — ABNORMAL LOW (ref 3.5–5.0)
Alkaline Phosphatase: 62 U/L (ref 38–126)
Anion gap: 9 (ref 5–15)
BUN: 10 mg/dL (ref 6–20)
CO2: 22 mmol/L (ref 22–32)
Calcium: 9.1 mg/dL (ref 8.9–10.3)
Chloride: 109 mmol/L (ref 98–111)
Creatinine, Ser: 0.91 mg/dL (ref 0.61–1.24)
GFR, Estimated: >60 mL/min (ref >60)
Glucose, Bld: 98 mg/dL (ref 70–99)
Potassium: 3.7 mmol/L (ref 3.5–5.1)
Sodium: 140 mmol/L (ref 135–145)
Total Bilirubin: 0.6 mg/dL (ref 0.0–1.2)
Total Protein: 5.7 g/dL — ABNORMAL LOW (ref 6.5–8.1)

## 2024-01-22 LAB — CBC
HCT: 36.1 % — ABNORMAL LOW (ref 39.0–52.0)
Hemoglobin: 12.7 g/dL — ABNORMAL LOW (ref 13.0–17.0)
MCH: 32.7 pg (ref 26.0–34.0)
MCHC: 35.2 g/dL (ref 30.0–36.0)
MCV: 93 fL (ref 80.0–100.0)
Platelets: 270 10*3/uL (ref 150–400)
RBC: 3.88 MIL/uL — ABNORMAL LOW (ref 4.22–5.81)
RDW: 12.4 % (ref 11.5–15.5)
WBC: 5.1 10*3/uL (ref 4.0–10.5)
nRBC: 0 % (ref 0.0–0.2)

## 2024-01-22 LAB — LACTATE DEHYDROGENASE: LDH: 109 U/L (ref 98–192)

## 2024-01-22 LAB — SEDIMENTATION RATE: Sed Rate: 5 mm/h (ref 0–16)

## 2024-01-22 LAB — PHOSPHORUS: Phosphorus: 4.7 mg/dL — ABNORMAL HIGH (ref 2.5–4.6)

## 2024-01-22 LAB — MAGNESIUM: Magnesium: 1.8 mg/dL (ref 1.7–2.4)

## 2024-01-22 MED ORDER — ACETAMINOPHEN 325 MG PO TABS: 650.0000 mg | ORAL_TABLET | Freq: Four times a day (QID) | ORAL | Status: AC | PRN

## 2024-01-22 NOTE — Progress Notes (Signed)
 Initial Nutrition Assessment  DOCUMENTATION CODES:   Not applicable  INTERVENTION:  Utilize prunes/peaches/pears for constipation Maintain adequate hydration   NUTRITION DIAGNOSIS:   Inadequate oral intake related to constipation as evidenced by per patient/family report.  GOAL:   Patient will meet greater than or equal to 90% of their needs  MONITOR:   PO intake, Weight trends, I & O's, Labs  REASON FOR ASSESSMENT:   Consult Assessment of nutrition requirement/status  ASSESSMENT:   23 year old male who presented as a direct admission from [facility] clinic on 01/21/24 with weight loss. PMH of constipation, autism/behavioral disturbances, recent hospitalization from 12/30/23 to 01/15/24 for aggressive bowel cleanout. Pt admitted with failure to thrive in adult.  Met with mom at bedside with MD. Mom shares that she commonly prepares all meals for pt. Breakfast: waffles, yogurt, fruit, bacon, sausage,  Lunch: sandwich, chips, fruit Dinner: baked meat Mom shares that she noticed he was getting larger last year and found that he was eating over the night while she was asleep. She limited that with putting up food so he could not easily obtain at night. She also started to make changes to his diet to be healthier including not eating out as often or adding in more vegetables. Mom shares that she has used dragon fruit to help with his constipation at home.  Per notes, pt was not eating well prior to previous admission, likely due to constipation.   Mom reports that pt was near 200# last year and that he was last known ~180# in January. Mom and him have been walking and trying to get in daily exercise on moms breaks from work during the day. MD concern that they did not obtain weight at discharge during last admission to assess pt true weight after aggressive bowel clean out. Per EMR, pt with a 16% weight loss within 11 months.   MD discussed possible discharge with follow-up outpatient to  assess weight status.   Nutrition Related Medications: Dulcolax, Folic Acid , Miralax , Senokot Labs reviewed.     NUTRITION - FOCUSED PHYSICAL EXAM:  Deferred due to pt sleeping.   Diet Order:   Diet Order             Diet regular Room service appropriate? Yes; Fluid consistency: Thin  Diet effective now                   EDUCATION NEEDS:   Education needs have been addressed  Skin:  Skin Assessment: Reviewed RN Assessment  Last BM:  01/22/24 large type 6  Height:  Ht Readings from Last 1 Encounters:  01/06/24 6\' 2"  (1.88 m)   Weight:  Wt Readings from Last 1 Encounters:  01/22/24 73.2 kg   Ideal Body Weight:  86.4 kg  BMI:  Body mass index is 20.72 kg/m.  Estimated Nutritional Needs:  Kcal:  2200-2400 Protein:  105-120 grams Fluid:  >2.2 L   Doneta Furbish RD, LDN Clinical Dietitian
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
## 2024-01-22 NOTE — Assessment & Plan Note (Addendum)
 Having loose BM at home. Home meds of dulcolax suppository BID, miralax  17 g BID, senna 17.2 mg BID. KUB improved, see media below.  -Continue home meds
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
## 2024-01-22 NOTE — Plan of Care (Signed)
  Problem: Education: Goal: Knowledge of General Education information will improve Description: Including pain rating scale, medication(s)/side effects and non-pharmacologic comfort measures Outcome: Adequate for Discharge   Problem: Health Behavior/Discharge Planning: Goal: Ability to manage health-related needs will improve Outcome: Adequate for Discharge   Problem: Clinical Measurements: Goal: Ability to maintain clinical measurements within normal limits will improve Outcome: Adequate for Discharge Goal: Will remain free from infection Outcome: Adequate for Discharge Goal: Diagnostic test results will improve Outcome: Adequate for Discharge Goal: Respiratory complications will improve Outcome: Adequate for Discharge Goal: Cardiovascular complication will be avoided Outcome: Adequate for Discharge   Problem: Activity: Goal: Risk for activity intolerance will decrease Outcome: Adequate for Discharge   Problem: Nutrition: Goal: Adequate nutrition will be maintained Outcome: Adequate for Discharge   Problem: Coping: Goal: Level of anxiety will decrease Outcome: Adequate for Discharge   Problem: Elimination: Goal: Will not experience complications related to bowel motility Outcome: Adequate for Discharge Goal: Will not experience complications related to urinary retention Outcome: Adequate for Discharge   Problem: Pain Managment: Goal: General experience of comfort will improve and/or be controlled Outcome: Adequate for Discharge   Problem: Safety: Goal: Ability to remain free from injury will improve Outcome: Adequate for Discharge   Problem: Skin Integrity: Goal: Risk for impaired skin integrity will decrease Outcome: Adequate for Discharge   Problem: Inadequate Protein-Energy Intake  (NI-5.3) Goal: Food and/or nutrient delivery Description: Individualized approach for food/nutrient provision. Outcome: Adequate for Discharge

## 2024-01-22 NOTE — Discharge Instructions (Addendum)
 Dear Radford D Stoney,  Thank you for letting us  participate in your care. You were hospitalized for unexplained weight loss and diagnosed with Failure to thrive in adult. You were treated with workup for causes of weight loss including malignancy and autoimmune and documenting stable weight.   POST-HOSPITAL & CARE INSTRUCTIONS Follow-up with the family medicine clinic for weight check. Please resume giving him the [place] . Go to your follow up appointments (listed below)   DOCTOR'S APPOINTMENT   Future Appointments  Date Time Provider Department Center  01/25/2024  4:10 PM ACCESS TO CARE POOL [facility]     Take care and be well!  Family Medicine Teaching Service Inpatient Team [place]  [facility]  [address] [place], [place] [address] [phone]

## 2024-01-22 NOTE — Assessment & Plan Note (Signed)
 Holding home carbamazepine , gabapentin , mirtazapine .  These medications may be related to constipation and mom has been holding them recently.  Paliperidone  benefit outweigh risk. Continue paliperidone  9 mg once daily

## 2024-01-22 NOTE — Progress Notes (Signed)
 Daily Progress Note Intern Pager: [phone]  Patient name: Brent Yang Medical record number: [id number] Date of birth: 2001/06/23 Age: 23 y.o. Gender: male  Primary Care Provider: Albin Huh, MD Consultants: None Code Status: Full  Pt Overview and Major Events to Date:  Brent Yang is a 23 year old male with past history of ASD and 2 week hospitalization for severe fecal impaction and colonic distention who was admitted for unexplained weight loss.  Patient was recently hospitalized and weights were not taken during this time.  Weight loss most likely explained by reduction of fecal burden but cannot be confirmed.  Plan to rule out failure to thrive, malignancy, etc.  Assessment and Plan:  Weights continue to downtrend but appear to be from being unclothed.  Once can document stable weight stable for discharge.  Mom is upset and wanting to leave AMA. Assessment & Plan Failure to thrive in adult 4/15 in clinic 187 lbs, January in clinic was 185 lbs, 4/23 had weight of 203 lbs in hospital, and today 167/165 lbs in clinic.  Weight most likely explained by reduction of fecal burden.  Malignancy and autoimmune ruled down with negative LDH and ESR. 166->164 lb today.  -Admit to FMTS, attending Dr. Bevin Bucks, Med-Surg -CBC, CMP, Mag, Phos -KUB -Daily weights -Strict I/Os -Continue home folic acid  1 mg daily -RD consult -Social work consult for APS update Constipation Having loose BM at home. Home meds of dulcolax suppository BID, miralax  17 g BID, senna 17.2 mg BID. KUB improved, see media below.  -Continue home meds Autism spectrum disorder Holding home carbamazepine , gabapentin , mirtazapine .  These medications may be related to constipation and mom has been holding them recently.  Paliperidone  benefit outweigh risk. Continue paliperidone  9 mg once daily   FEN/GI: Regular diet PPx: Not indicated Dispo:Home tomorrow. Barriers include stable weight.   Subjective:  Patient  nonverbal and does not talk.  Nods no to pain.  Nods yes to pooping.  APS talk to nurse and requested callback to [facility] at [phone].   Objective: Temp:  [97.5 F (36.4 C)-98.3 F (36.8 C)] 97.7 F (36.5 C) (05/09 1000) Pulse Rate:  [72-87] 78 (05/09 1000) Resp:  [16-18] 18 (05/09 1000) BP: (90-113)/(52-82) 104/63 (05/09 1000) SpO2:  [98 %-100 %] 100 % (05/09 1000) Weight:  [73.2 kg-75.6 kg] 73.2 kg (05/09 1100) Physical Exam: General: Calm and cooperative.  Nonverbal. Cardiovascular: Normal rate and rhythm.  Normal heart sounds. Respiratory: Normal effort.  Clear to auscultation bilaterally. Abdomen: Soft and nontender. Extremities: No abnormalities.  Laboratory: Most recent CBC Lab Results  Component Value Date   WBC 5.1 01/22/2024   HGB 12.7 (L) 01/22/2024   HCT 36.1 (L) 01/22/2024   MCV 93.0 01/22/2024   PLT 270 01/22/2024   Most recent BMP    Latest Ref Rng & Units 01/22/2024    5:59 AM  BMP  Glucose 70 - 99 mg/dL 98   BUN 6 - 20 mg/dL 10   Creatinine 6.29 - 1.24 mg/dL 5.28   Sodium 413 - 244 mmol/L 140   Potassium 3.5 - 5.1 mmol/L 3.7   Chloride 98 - 111 mmol/L 109   CO2 22 - 32 mmol/L 22   Calcium 8.9 - 10.3 mg/dL 9.1     Phosphorus: 4.7 Mag: 1.8 Lactate dehydrogenase: 109 ESR: 5   Left photo is 4/27, right photo is 5/8. Appears improved  Verdell Given, MD 01/22/2024, 12:50 PM  PGY-1, [facility] Family Medicine FPTS Intern pager: [phone], text pages  welcome Secure chat group [facility] Teaching Service

## 2024-01-22 NOTE — Telephone Encounter (Signed)
 Dr. Irby Mannan attempted to call this # back several times this afternoon and could not reach APS contact.  Candee Cha, MD

## 2024-01-22 NOTE — Telephone Encounter (Signed)
 Received call from [facility] with [facility] APS requesting to speak with provider regarding APS referral.   She states that initial referral indicated concerns about patient receiving medications appropriately.   Advised Nichole that I would reach out to PCP regarding requested update from our office.   Please return call to [facility] at [phone].  Elsie Halo, RN

## 2024-01-22 NOTE — Hospital Course (Addendum)
 Brent Yang is a 23 y.o.male with a history of ASD who was admitted to the family teaching Service at [facility] for explain 20 pound weight loss. Her hospital course is detailed below:  Unexplained weight loss 4/15 in clinic 187 lbs, January in clinic was 185 lbs, 4/23 had weight of 203 lbs in hospital, and today 167/165 lbs in clinic. Did get aggressive bowel clean out in hospital. Concern for 20 lb weight loss in less than a month and failure to thrive.  Most likely from removal of excessive fecal mass during last hospitalization but want to rule out malignancy and negligence at home. Unable to get ahold of APS case update throughout hospitalization with multiple attempts, but low concern for neglect based on physician family interaction. Weight most likely explained by reduction of fecal burden.  Malignancy and autoimmune very low suspicion with negative LDH and ESR. Patient discharged with weight stable, and close follow-up for subsequent weight check.   Constipation Having loose BM at home. Home meds of dulcolax suppository BID, miralax  17 g BID, senna 17.2 mg BID.   Other chronic conditions were medically managed with home medications and formulary alternatives as necessary (autism/behavioral disturbance)  PCP Follow-up Recommendations: Weight Check

## 2024-01-22 NOTE — Plan of Care (Signed)

## 2024-01-25 ENCOUNTER — Ambulatory Visit (INDEPENDENT_AMBULATORY_CARE_PROVIDER_SITE_OTHER): Payer: MEDICAID | Admitting: Family Medicine

## 2024-01-25 VITALS — BP 123/77 | HR 79 | Ht 74.0 in | Wt 168.8 lb

## 2024-01-25 DIAGNOSIS — K5901 Slow transit constipation: Secondary | ICD-10-CM | POA: Diagnosis not present

## 2024-01-25 NOTE — Assessment & Plan Note (Signed)
 Exam improved today.  He has been able to gain weight with improvement in his appetite.  His mother has been able to continue his bowel regimen.  Continue current regimen.  Follow-up in 1 month or sooner if bowel movements decrease or patient starts to experience more distention/pain.

## 2024-01-25 NOTE — Progress Notes (Signed)
    SUBJECTIVE:   CHIEF COMPLAINT / HPI:   Hospital follow-up Recently admitted 5/8 to 5/9 for explain weight loss/failure to thrive rule out.  Malignancy and autoimmune cause less likely after workup was negative for LDH and ESR.  Felt weight loss was due to large stool removal during previous admission from 4/17 to 5/2.  Since leaving hospital, they have continued Dulcolax suppository twice daily, MiraLAX  17 g twice daily, and senna 17.2 mg twice daily without issue.  He is up 3 or so pounds since discharge (his last weight of 161 was without clothes on versus today with clothes). His appetite is much better now.  PERTINENT  PMH / PSH: Allergies, fecal impaction of the rectum, intellectual disability/ASD  OBJECTIVE:   BP 123/77   Pulse 79   Ht 6\' 2"  (1.88 m)   Wt 168 lb 12.8 oz (76.6 kg)   SpO2 100%   BMI 21.67 kg/m   General: Alert, in NAD Skin: Warm, dry, and intact HEENT: NCAT, EOM grossly normal, midline nasal septum Cardiac: RRR, no m/r/g appreciated Respiratory: CTAB, breathing comfortably on RA Abdominal: Soft, nontender, nondistended, normoactive bowel sounds Extremities: Moves all extremities grossly equally Psychiatric: Baseline affect, pleasant, intellectually delayed  ASSESSMENT/PLAN:   Assessment & Plan Constipation due to slow transit Exam improved today.  He has been able to gain weight with improvement in his appetite.  His mother has been able to continue his bowel regimen.  Continue current regimen.  Follow-up in 1 month or sooner if bowel movements decrease or patient starts to experience more distention/pain.   Genetta Kenning, MD [facility]

## 2024-01-25 NOTE — Patient Instructions (Signed)
 You are doing a great job!  Continue the Dulcolax suppositories, MiraLAX , and senna.  Come back in 1 month to follow-up or sooner if you feel like he is not having the same response to this bowel regimen.

## 2024-02-09 NOTE — Telephone Encounter (Signed)
"  Miss Draper" with APS LVM on nurse line requesting to speak with provider regarding APS referral.   She reports per her records, no one has gotten in contact with them.   She reports she can be reached at [phone].   Unsure who to forward this is message to at this point.   Will forward to PCP.

## 2024-02-10 NOTE — Telephone Encounter (Signed)
 Got call from Miss Draper from APS. APS case is now closed. Updated her that pt has come to follow-up visits in clinic on 5/12 and that he was doing well at that time. No further concerns at this time.

## 2024-02-23 ENCOUNTER — Ambulatory Visit (INDEPENDENT_AMBULATORY_CARE_PROVIDER_SITE_OTHER): Payer: MEDICAID | Admitting: Family Medicine

## 2024-02-23 ENCOUNTER — Encounter: Payer: Self-pay | Admitting: Family Medicine

## 2024-02-23 VITALS — BP 127/73 | HR 94 | Ht 74.0 in | Wt 169.2 lb

## 2024-02-23 DIAGNOSIS — K5901 Slow transit constipation: Secondary | ICD-10-CM

## 2024-02-23 MED ORDER — POLYETHYLENE GLYCOL 3350 17 GM/SCOOP PO POWD: 17.0000 g | Freq: Three times a day (TID) | ORAL | 3 refills | Status: AC

## 2024-02-23 MED ORDER — BISACODYL 10 MG RE SUPP: 10.0000 mg | Freq: Two times a day (BID) | RECTAL | 3 refills | Status: AC

## 2024-02-23 MED ORDER — SENNA 8.6 MG PO TABS: 2.0000 | ORAL_TABLET | Freq: Two times a day (BID) | ORAL | 3 refills | Status: AC

## 2024-02-23 NOTE — Progress Notes (Signed)
    SUBJECTIVE:   CHIEF COMPLAINT / HPI:    Brent Yang is a 23yo M w/ hx of ASD, severe constipation leading to impaction and colonic dilation that pf constipation f/u. P tpresents with mom who provides history. - Mom reports staying consistent with regimen of miralax , senna, and ducolax suppository BID. - Mom finds this manageable. Pt is used to getting suppositories now.  - Mom is concerned that he might not be pooping enough.  - Has about 1 big stool a week, with other small BM's in between.  - She also gives him prunes to help.  - Pt is eating well. No issues w/ appetite. - Mom wonders if he needs a GI consult for since he was so distended from his impaction  OBJECTIVE:   BP 127/73   Pulse 94   Ht 6' 2 (1.88 m)   Wt 169 lb 3.2 oz (76.7 kg)   SpO2 100%   BMI 21.72 kg/m   General: Alert, pleasant young man. NAD. HEENT: NCAT. MMM. CV: RRR, no murmurs. Resp: CTAB, no wheezing or crackles. Normal WOB on RA.  Abm: Soft, nontender, nondistended. BS present. Ext: Moves all ext spontaneously Skin: Warm, well perfused   ASSESSMENT/PLAN:   Assessment & Plan Constipation due to slow transit Improving since his 4/16 admission for fecal impaction. Provided reassurance to mom. Tolerating PO well, abm exam benign, and weight gaining. Pt is having less frequent Bms than desired, so will increase miralax  to TID. Since pt continues to improve, can hold on GI referral for now. - Miralax  TID - senna bID - ducolax BID - f/u in 3 months - return precautions counseled.    Brent Huh, MD [facility]

## 2024-02-23 NOTE — Patient Instructions (Signed)
 Good to see you today - Thank you for coming in  Things we discussed today:  1) Brent Yang's weight is stable and stomach feels good. - You can increase your miralax  to 3 times a day - Continue taking senna twice a day - Continue dulcolax suppository twice a day  2) The goal is for him to have smooth, painless bowel movements. I would like him to be having more bowel movement than once a week. Closer to 1 every 1-3 days is better.  Please bring him back sooner if: - he is losing weight - not pooping for over 1 week - vomiting uncontrollably - not eating normally   Come back to see me in 3 months.

## 2024-02-25 NOTE — Assessment & Plan Note (Signed)
 Improving since his 4/16 admission for fecal impaction. Provided reassurance to mom. Tolerating PO well, abm exam benign, and weight gaining. Pt is having less frequent Bms than desired, so will increase miralax  to TID. Since pt continues to improve, can hold on GI referral for now. - Miralax  TID - senna bID - ducolax BID - f/u in 3 months - return precautions counseled.

## 2024-03-03 ENCOUNTER — Other Ambulatory Visit: Payer: Self-pay | Admitting: Family Medicine

## 2024-03-03 NOTE — Progress Notes (Signed)
 Requested by mom for an updated medication list.  Current Medications include: Propranolol  40 mg twice a day Paliperidone  9 mg once a day Dulcolax suppository 10 mg twice a day MiraLAX  17 g 3 times a day Senna 2 tablets twice a day Melatonin as needed for sleep   - Continue to hold carbamazepine , gabapentin , mirtazapine .

## 2024-03-22 ENCOUNTER — Encounter: Payer: Self-pay | Admitting: Family Medicine

## 2024-03-22 ENCOUNTER — Ambulatory Visit: Payer: MEDICAID | Admitting: Family Medicine

## 2024-03-22 VITALS — BP 107/59 | HR 80 | Ht 74.0 in | Wt 171.2 lb

## 2024-03-22 DIAGNOSIS — K5900 Constipation, unspecified: Secondary | ICD-10-CM

## 2024-03-22 DIAGNOSIS — F840 Autistic disorder: Secondary | ICD-10-CM | POA: Diagnosis not present

## 2024-03-22 NOTE — Progress Notes (Signed)
    SUBJECTIVE:   Chief compliant/HPI: annual examination  Lynford D Shepheard is a 23 y.o. who presents today for an annual exam.   - Mom needs forms filled out for caregiver pay.  Constipation - Pt is doing well with his current regimen. He is eating consistently and is gaining wait.  - He is taking his medications consistently   Reviewed and updated history.   Review of systems form notable for.    OBJECTIVE:   BP (!) 107/59   Pulse 80   Ht 6' 2 (1.88 m)   Wt 171 lb 3.2 oz (77.7 kg)   SpO2 99%   BMI 21.98 kg/m   General: Alert, pleasant man. NAD. HEENT: NCAT. MMM. CV: RRR, no murmurs. Cap refill <2. Resp: CTAB, no wheezing or crackles. Normal WOB on RA.  Abm: Soft, nontender, nondistended. BS present. Ext: Moves all ext spontaneously Skin: Warm, well perfused   ASSESSMENT/PLAN:   Assessment & Plan Constipation, unspecified constipation type Chronic, controlled. Weight is stable and gaining. Abm exam benign.   Annual Examination  See AVS for age appropriate recommendations  Blood pressure reviewed and at goal.     Follow up in 1 year or sooner if indicated.  MyChart Activation: Already signed up  Twyla Nearing, MD [facility]

## 2024-03-22 NOTE — Patient Instructions (Signed)
 1) Brent Yang's constipation seems to be doing well. His weight is stable and stomach feels soft.  2) Fax over the documents that you need filled out to our clinic. If you have any issues, feel free to call our clinic or message us  on MyChart

## 2024-03-24 NOTE — Assessment & Plan Note (Signed)
 Mom is patient's primary caregiver.  Advised mom to send over any forms necessary for her to continue getting caregiver support.

## 2024-03-24 NOTE — Assessment & Plan Note (Addendum)
 Chronic, controlled. Weight is stable and gaining. Abm exam benign.
